# Patient Record
Sex: Female | Born: 1970 | Race: White | Hispanic: No | Marital: Married
Health system: Midwestern US, Community
[De-identification: ages and names within clinical notes are randomized; demographics above are authoritative.]

## PROBLEM LIST (undated history)

## (undated) DIAGNOSIS — R002 Palpitations: Secondary | ICD-10-CM

## (undated) DIAGNOSIS — J45909 Unspecified asthma, uncomplicated: Secondary | ICD-10-CM

## (undated) DIAGNOSIS — E079 Disorder of thyroid, unspecified: Secondary | ICD-10-CM

## (undated) DIAGNOSIS — U071 COVID-19: Secondary | ICD-10-CM

## (undated) DIAGNOSIS — K5900 Constipation, unspecified: Secondary | ICD-10-CM

## (undated) DIAGNOSIS — T7840XA Allergy, unspecified, initial encounter: Secondary | ICD-10-CM

## (undated) DIAGNOSIS — F419 Anxiety disorder, unspecified: Secondary | ICD-10-CM

## (undated) DIAGNOSIS — K219 Gastro-esophageal reflux disease without esophagitis: Secondary | ICD-10-CM

## (undated) DIAGNOSIS — I82409 Acute embolism and thrombosis of unspecified deep veins of unspecified lower extremity: Secondary | ICD-10-CM

## (undated) DIAGNOSIS — D649 Anemia, unspecified: Secondary | ICD-10-CM

## (undated) DIAGNOSIS — G709 Myoneural disorder, unspecified: Secondary | ICD-10-CM

## (undated) HISTORY — DX: Anemia, unspecified: D64.9

## (undated) HISTORY — DX: Myoneural disorder, unspecified: G70.9

## (undated) HISTORY — DX: Unspecified asthma, uncomplicated: J45.909

## (undated) HISTORY — PX: TONSILLECTOMY: SUR1361

## (undated) HISTORY — DX: Palpitations: R00.2

## (undated) HISTORY — PX: APPENDECTOMY: SHX54

## (undated) HISTORY — DX: Gastro-esophageal reflux disease without esophagitis: K21.9

## (undated) HISTORY — DX: Allergy, unspecified, initial encounter: T78.40XA

## (undated) HISTORY — DX: Constipation, unspecified: K59.00

## (undated) HISTORY — PX: CARPAL TUNNEL RELEASE: SHX101

## (undated) HISTORY — PX: COLONOSCOPY: SHX174

## (undated) HISTORY — PX: SPINAL FUSION: SHX223

## (undated) HISTORY — DX: Acute embolism and thrombosis of unspecified deep veins of unspecified lower extremity: I82.409

## (undated) HISTORY — DX: Disorder of thyroid, unspecified: E07.9

## (undated) HISTORY — DX: Anxiety disorder, unspecified: F41.9

## (undated) HISTORY — DX: COVID-19: U07.1

## (undated) HISTORY — PX: ROTATOR CUFF REPAIR: SHX139

---

## 1998-05-04 NOTE — ED Provider Notes (Signed)
[facility]                      EMERGENCY DEPARTMENT TREATMENT REPORT   NAME:  Vickie, Vickie [name]   MR#:  [id number]     BILLING #: [id number]         DOS: 05/04/1998  TIME:11:32                                                                    P   cc:   RICHARD L. Jama Flavors, M.D.   Primary Physician:   CHIEF COMPLAINT:   Intermittent shortness of breath and dizziness, syncope.   HISTORY OF PRESENT ILLNESS:   The patient states that since yesterday she   has had some intermittent episodes of shortness of breath, but no coughing   or chest pain.  She also denies having any numbness or tingling in her face   or extremities.  She has had three episodes today of some very short-lived   syncopal episodes, one witnessed by her husband in the Emergency Department   while awaiting evaluation.  She has had no upper respiratory infection   symptoms, nor has she had any fever, chills, coughing, or chest pain.  She   denies any urinary tract infection symptoms or vaginal discharge and has   been feeling normal fetal heart movement. She had called Dr. Jari Favre,   her OB/GYN doctor this morning and was told to come to the Emergency   Department. He called and asked that we evaluate the patient.   REVIEW OF SYSTEMS:   CONSTITUTIONAL:  No fever, chills, weight loss.   EYES:  She has some floaters associated with dizziness and syncope in the   past 48 hours.   ENT: No sore throat, runny nose or other URI symptoms.   ENDOCRINE:  No diabetic symptoms.   RESPIRATORY: Intermittent shortness of breath over the past two days, but   no cough or wheezing.   CARDIOVASCULAR:  No chest pain, chest pressure, or palpitations.   GASTROINTESTINAL:  No vomiting, diarrhea, or abdominal pain.   GENITOURINARY:  No dysuria, frequency, or urgency.   MUSCULOSKELETAL:  No joint pain or swelling.   NEUROLOGICAL:   The dizziness with syncope over the past two days.    PAST MEDICAL HISTORY:   No chronic illnesses, but did have an episode of   toxemia late in her first pregnancy and currently is [redacted] weeks pregnant.   G2-P1-AB0.   ALLERGIES:  Erythromycin.   MEDICATIONS:  Prenatal vitamins.   PHYSICAL EXAMINATION:   GENERAL:   28 -year-old white female who is alert and oriented   times three.   VITAL SIGNS:  Blood pressure 136/72, temperature 97.8, pulse 80,   respirations 20, and O2 sat 99% on room air.   HEENT:  Eyes PERL, EOMs within normal limits. Sinuses nontender.   NECK:  Nontender without masses.   LUNGS:  Clear to auscultation bilaterally.   HEART:  Regular rate and rhythm.  No murmurs, gallops or rubs.   ABDOMEN:  Gravid, BS positive, soft, and nontender.   NEUROLOGIC:  Cranial nerves, deep tendon reflexes, strength, and light   touch sensation are unremarkable - done with the patient supine in  bed.   CONTINUATION BY DR. MANOLIO:   IMPRESSION/MANAGEMENT PLAN:  This is a new problem for this patient.  As an   acute illness posing a potential threat to life or bodily function, this is   a high risk presentation necessitating an immediate diagnostic evaluation.   Old records were reviewed.  No additional relevant information was   obtained.  The patient's history was discussed  with family members.  No   additional relevant information was obtained.  Nursing notes were reviewed.   DIAGNOSTIC TESTING:  The EKG showed normal sinus rhythm and no ischemic   changes.  Urinalysis is negative, the i-STAT is normal with a BUN of 7 and   H&amp;H of 12 and 34.  Blood gases on room air showed a pH of 7.43, pC02 31.9,   p02 95.8, 02 saturation 98%.   COURSE IN THE EMERGENCY DEPARTMENT:  Fetal heart tones were done were 160   and regular.  The patient was ordered t have orthostatic vital signs, but   she passed out on standing so these were canceled.  She received two liters   of Lactated Ringer's and was attempted to ambulate at 1615; and she became    very dizzy and near syncopal again.  Orthostatic vital signs were done at   that time which were essentially normal.   I contacted Dr. Marden Noble, who asked me to call Dr. Wallace Cullens, who is in house.  Dr.   Wallace Cullens request that I contact Cardiology and Dr. Cleon Gustin is here seeing the   patient.   FINAL DIAGNOSIS:   1.  Recurrent syncope/near syncope.   2.  Six months' pregnancy.   DISPOSITION:  Per Dr. Cleon Gustin.   _________________________________   [name]. Jama Flavors, M.D.   zga/cd  D:  05/04/1998 T:  05/04/1998 11:41 A   [id number]   Claris Pong, PA

## 1998-05-04 NOTE — Procedures (Signed)
[facility]                              CARDIOLOGY DEPARTMENT                              ECHOCARDIOGRAM REPORT   Name: [name], Vickie                 Location: OP       Age: 27   Date: 05/05/1998   Ref Phys: C. Ashby                     MR#: [id number]      Tape/Index:   [id number]   Reason For Study: Syncope   M-Mode Measurements (Parasternal):              Measured:               Nor   mal:   RV Internal Diastolic Diameter                   20 mm      7-23 mm   Ventricular Septal Thickness                     10 mm      7-11 mm   LV Internal Diastolic Diameter                   39 mm      37-54 mm   LV Posterior Wall Thickness                       9 mm      7-11 mm   LV Internal Systolic Diameter                    26 mm   Aortic Root Diameter                             27 mm      20-37 mm   Aortic Valve Cusp Separation                     18 mm      16-26 mm   Left Atrial Diameter                             30 mm      19-40 mm   2-Dimensional and Doppler Findings:   INDICATION:  Syncope.   FINDINGS:   1.   There  is  overall  normal  left ventricular  systolic  function  with       estimated left ventricular ejection  fraction  is  60%  .  There are no       regional wall motion abnormalities detected.   2.  Normal right heart size and function.   3.  Intrinsic, but normal aortic, mitral, and tricuspid valve.   4.  No abnormal color flow velocity patterns.   5.  No pericardial effusion.   PROCEDURES PERFORMED:  2-dimensional, color flow and Doppler analysis.   __________________________________________________   WAYNE OLD, M.D.   bes  D: 05/05/1998  T: 05/06/1998  1:15   P    [id number]

## 1998-05-04 NOTE — H&P (Signed)
[facility]                              HISTORY AND PHYSICAL   NAME:    Vickie [name], Vickie [name]   MR#:     [id number]                           ADM DATE:         05/04/1998   SS#      [id number]                        PT. LOCATION      [facility]   Harland German., M.D.   cc:   Harland German., M.D.         Franki Cabot Hubert Azure, M.D.         Glenna Durand, M.D.-[facility]   CHIEF COMPLAINT:  "I am passing out."    Manha Amato is a 28 year old Caucasian woman (DOB Jan 08, 2071) with the   following medical problems:   1.  Dizziness and syncope.         A)  Orthostatic increase in heart rate with no drop in blood pressure               following 2 liter Lactated Ringer's infusion.         B)  No anemia or history to suggest dehydration.         C)  Rule out cardiomyopathy or paracardial effusion.   2.  Gravida II, Para I--25 week uncomplicated intrauterine pregnancy.   3.  History of fibromyalgia.   4.  Surgical history-status post C-section and remote appendectomy.   ALLERGIES:  Erythromycin.   HISTORY OF PRESENT ILLNESS:  Ms. Ruegg is followed by Dr. Edwyna Shell for   general medical care with no history of any known general medical problems   other than her fibromyalgia.  This has been under control and does not   require specific therapy.  The patient reports she has being well with her   second pregnancy.   The patient presents with a 2-day history of dizziness.  She reports   feeling "light-headed" from time to time and seeing "spots in from of my   eyes" yesterday.  Today, she has had several episodes of being very dizzy   and on two occasionally passing out and falling to the floor after she   stands up.  She notes that her heart beats faster when she is doing any   activity and her heart beats faster when she is feeling light-headed.  She   has not head any chest pain, nausea or vomiting.  She has noted shortness    of breath yesterday and today when she would climb stairs-no episodes of   any PND or orthopnea.  She has had mild pedal edema-no worse than with her   previous pregnancy.   The patient came to the emergency department for evaluation.  When   orthostatic blood pressures were initially attempted, she became nearly   syncopal and had to be helped back to the stretcher-the seated and standing   blood pressures were not obtained initially. The patient was given 2 liters   of lactated Ringer's and felt somewhat better.  Dr. Wallace Cullens was contacted and   asked that we see Ms. Checketts for assistance with  her evaluation.  At the   time of my questioning, the patient does say that she is feeling better.   She denies any urinary frequency or dysuria and has not had any fever. She   does feel "a little chill" when she is dizzy.   Coronary risk factor profile is remarkable only for a family history of   coronary disease-paternal grandfather and uncle both had coronary bypass.   The patient denies hypertension or diabetes and has never smoked   cigarettes.  Cholesterol status is unknown.   MEDICATIONS ON ADMISSION :  Prenatal vitamins only.   SOCIAL HISTORY:  The patient is married and has one healthy child.  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   REVIEW OF SYSTEMS:  Unremarkable except as noted, with no GU or previous   neurologic complaints.  She has not had any headache or GI bleeding.   PHYSICAL EXAMINATION:   GENERAL:  Shows a well-developed, well-nourished woman who is not in any   distress at the time of examination.   VITAL SIGNS:  Pulse is 85 and regular supine, seated and standing blood   pressures are 130/50 with heart rate increasing from 85 supine to 95 seated   and than 120-122 standing.   NECK:  Not distended.   LUNGS:  Clear.   CARDIAC:  Shows regular rate and rhythm.  There is no abdominal tenderness   and no significant pedal edema.    OBJECTIVE DATA:  The 12-lead ECG shows sinus rhythm, within normal limits.   Arterial blood gasses on room air showed pO2 96, pCO2 32, pH 7.43. Sodium   is 141, potassium 3.8, BUN 7, on I-STAT testing.  Hematocrit was 34%.   IMPRESSION:  The patient has symptomatic orthostasis that has been   partially corrected with IV fluids.  Her blood pressure no longer drops,   but she does feel dizzy and has orthostatic increase in heart rate.  She   has no symptoms to suggest blood loss or urinary tract infection.   Echocardiography will be needed to rule out pericardial effusion or   cardiomyopathy.   PLAN:  The patient will be monitored overnight in the [facility] with plans to   recheck her orthostatic blood pressures in the morning.  Echocardiography   will be obtained to rule out pregnancy-related cardiomyopathy or   pericardial effusion as possible causes for her otherwise unexplained   orthostasis.   I am grateful to Dr. Wallace Cullens for the opportunity of seeing Ms. Cathlean Marseilles   ______________________________   Harland German., M.D.   Jonte.Jarred  D:  05/04/1998  T:  05/04/1998  5:55 P   [id number]

## 1998-05-06 NOTE — Procedures (Signed)
[facility]                              CARDIOLOGY DEPARTMENT                              ECHOCARDIOGRAM REPORT   Name: SUZZANNE, BRUNKHORST                 Location: OP       Age: 28   Date: 05/05/1998   Ref Phys: C. Cleon Gustin                     MR#: [id number]      Tape/Index:   [id number]   Reason For Study: Syncope   M-Mode Measurements (Parasternal):              Measured:               Nor   mal:   RV Internal Diastolic Diameter                   20 mm      7-23 mm   Ventricular Septal Thickness                     10 mm      7-11 mm   LV Internal Diastolic Diameter                   39 mm      37-54 mm   LV Posterior Wall Thickness                       9 mm      7-11 mm   LV Internal Systolic Diameter                    26 mm   Aortic Root Diameter                             27 mm      20-37 mm   Aortic Valve Cusp Separation                     18 mm      16-26 mm   Left Atrial Diameter                             30 mm      19-40 mm   2-Dimensional and Doppler Findings:   INDICATION:  Syncope.   FINDINGS:   1.   There  is  overall  normal  left ventricular  systolic  function  with       estimated left ventricular ejection  fraction  is  60%  .  There are no       regional wall motion abnormalities detected.   2.  Normal right heart size and function.   3.  Intrinsic, but normal aortic, mitral, and tricuspid valve.   4.  No abnormal color flow velocity patterns.   5.  No pericardial effusion.   PROCEDURES PERFORMED:  2-dimensional, color flow and Doppler analysis.   __________________________________________________   Sandre Kitty, M.D.   bes  D: 05/05/1998  T: 05/06/1998  1:15  P    [id number]

## 2019-04-08 ENCOUNTER — Other Ambulatory Visit: Payer: Self-pay

## 2019-04-08 ENCOUNTER — Emergency Department (HOSPITAL_COMMUNITY)
Admission: EM | Admit: 2019-04-08 | Discharge: 2019-04-09 | Discharge status: Against Medical Advice | Payer: Commercial Managed Care - PPO | Attending: Emergency Medicine | Admitting: Emergency Medicine

## 2019-04-08 DIAGNOSIS — Z5321 Procedure and treatment not carried out due to patient leaving prior to being seen by health care provider: Secondary | ICD-10-CM | POA: Diagnosis not present

## 2019-04-08 DIAGNOSIS — R519 Headache, unspecified: Secondary | ICD-10-CM | POA: Diagnosis present

## 2019-04-08 LAB — COMPREHENSIVE METABOLIC PANEL
ALT: 30 U/L (ref 0–44)
AST: 35 U/L (ref 15–41)
Albumin: 3.6 g/dL (ref 3.5–5.0)
Alkaline Phosphatase: 86 U/L (ref 38–126)
Anion gap: 10 (ref 5–15)
BUN: 9 mg/dL (ref 6–20)
CO2: 23 mmol/L (ref 22–32)
Calcium: 9.2 mg/dL (ref 8.9–10.3)
Chloride: 106 mmol/L (ref 98–111)
Creatinine, Ser: 0.62 mg/dL (ref 0.44–1.00)
GFR calc Af Amer: >60 mL/min (ref >60)
GFR calc non Af Amer: >60 mL/min (ref >60)
Glucose, Bld: 104 mg/dL — ABNORMAL HIGH (ref 70–99)
Potassium: 3.8 mmol/L (ref 3.5–5.1)
Sodium: 139 mmol/L (ref 135–145)
Total Bilirubin: 0.2 mg/dL — ABNORMAL LOW (ref 0.3–1.2)
Total Protein: 6.8 g/dL (ref 6.5–8.1)

## 2019-04-08 LAB — CBC WITH DIFFERENTIAL/PLATELET
Abs Immature Granulocytes: 0.03 10*3/uL (ref 0.00–0.07)
Basophils Absolute: 0.1 10*3/uL (ref 0.0–0.1)
Basophils Relative: 1 %
Eosinophils Absolute: 0.3 10*3/uL (ref 0.0–0.5)
Eosinophils Relative: 3 %
HCT: 41.6 % (ref 36.0–46.0)
Hemoglobin: 13.7 g/dL (ref 12.0–15.0)
Immature Granulocytes: 0 %
Lymphocytes Relative: 44 %
Lymphs Abs: 3.7 10*3/uL (ref 0.7–4.0)
MCH: 27.8 pg (ref 26.0–34.0)
MCHC: 32.9 g/dL (ref 30.0–36.0)
MCV: 84.4 fL (ref 80.0–100.0)
Monocytes Absolute: 0.7 10*3/uL (ref 0.1–1.0)
Monocytes Relative: 8 %
Neutro Abs: 3.7 10*3/uL (ref 1.7–7.7)
Neutrophils Relative %: 44 %
Platelets: 394 10*3/uL (ref 150–400)
RBC: 4.93 MIL/uL (ref 3.87–5.11)
RDW: 15 % (ref 11.5–15.5)
WBC: 8.4 10*3/uL (ref 4.0–10.5)
nRBC: 0 % (ref 0.0–0.2)

## 2019-04-08 NOTE — ED Notes (Signed)
Pt states she is going to leave.

## 2019-04-08 NOTE — ED Triage Notes (Signed)
Pt states she had a epidural injection into her back on new years eve due to chronic back pt. Pt states the following day she had a migraine that has been constant and today she also noticed some numbness on the left side of her face. Pt has no weakness, no loss of bowel or bladder.

## 2019-05-21 ENCOUNTER — Ambulatory Visit (HOSPITAL_COMMUNITY)
Admission: RE | Admit: 2019-05-21 | Discharge: 2019-05-21 | Disposition: A | Discharge status: Home / Self Care | Payer: HRSA Program | Source: Ambulatory Visit | Attending: Pulmonary Disease | Admitting: Pulmonary Disease

## 2019-05-21 ENCOUNTER — Other Ambulatory Visit: Payer: Self-pay | Admitting: Nurse Practitioner

## 2019-05-21 ENCOUNTER — Encounter (HOSPITAL_COMMUNITY): Payer: Self-pay

## 2019-05-21 DIAGNOSIS — U071 COVID-19: Secondary | ICD-10-CM

## 2019-05-21 DIAGNOSIS — Z6835 Body mass index (BMI) 35.0-35.9, adult: Secondary | ICD-10-CM | POA: Insufficient documentation

## 2019-05-21 DIAGNOSIS — E6609 Other obesity due to excess calories: Secondary | ICD-10-CM

## 2019-05-21 MED ORDER — SODIUM CHLORIDE 0.9 % IV SOLN
INTRAVENOUS | Status: DC | PRN
  Administered 2019-05-21: 17:00:00 250 mL via INTRAVENOUS

## 2019-05-21 MED ORDER — SODIUM CHLORIDE 0.9 % IV SOLN
700.0000 mg | Freq: Once | INTRAVENOUS | Status: AC
  Administered 2019-05-21: 700 mg via INTRAVENOUS
  Filled 2019-05-21: qty 20

## 2019-05-21 MED ORDER — FAMOTIDINE IN NACL 20-0.9 MG/50ML-% IV SOLN: 20.0000 mg | Freq: Once | INTRAVENOUS | Status: DC | PRN

## 2019-05-21 MED ORDER — ALBUTEROL SULFATE HFA 108 (90 BASE) MCG/ACT IN AERS: 2.0000 | INHALATION_SPRAY | Freq: Once | RESPIRATORY_TRACT | Status: DC | PRN

## 2019-05-21 MED ORDER — EPINEPHRINE 0.3 MG/0.3ML IJ SOAJ: 0.3000 mg | Freq: Once | INTRAMUSCULAR | Status: DC | PRN

## 2019-05-21 MED ORDER — METHYLPREDNISOLONE SODIUM SUCC 125 MG IJ SOLR: 125.0000 mg | Freq: Once | INTRAMUSCULAR | Status: DC | PRN

## 2019-05-21 MED ORDER — DIPHENHYDRAMINE HCL 50 MG/ML IJ SOLN: 50.0000 mg | Freq: Once | INTRAMUSCULAR | Status: DC | PRN

## 2019-05-21 NOTE — Progress Notes (Signed)
  I connected by phone with Jean Huber on 05/21/2019 at 3:09 PM to discuss the potential use of an new treatment for mild to moderate COVID-19 viral infection in non-hospitalized patients.  This patient is a 49 y.o. female that meets the FDA criteria for Emergency Use Authorization of bamlanivimab or casirivimab\imdevimab.  Has a (+) direct SARS-CoV-2 viral test result  Has mild or moderate COVID-19   Is ? 51 years of age and weighs ? 40 kg  Is NOT hospitalized due to COVID-19  Is NOT requiring oxygen therapy or requiring an increase in baseline oxygen flow rate due to COVID-19  Is within 10 days of symptom onset  Has at least one of the high risk factor(s) for progression to severe COVID-19 and/or hospitalization as defined in EUA.  Specific high risk criteria : BMI >/= 35   I have spoken and communicated the following to the patient or parent/caregiver:  1. FDA has authorized the emergency use of bamlanivimab and casirivimab\imdevimab for the treatment of mild to moderate COVID-19 in adults and pediatric patients with positive results of direct SARS-CoV-2 viral testing who are 50 years of age and older weighing at least 40 kg, and who are at high risk for progressing to severe COVID-19 and/or hospitalization.  2. The significant known and potential risks and benefits of bamlanivimab and casirivimab\imdevimab, and the extent to which such potential risks and benefits are unknown.  3. Information on available alternative treatments and the risks and benefits of those alternatives, including clinical trials.  4. Patients treated with bamlanivimab and casirivimab\imdevimab should continue to self-isolate and use infection control measures (e.g., wear mask, isolate, social distance, avoid sharing personal items, clean and disinfect "high touch" surfaces, and frequent handwashing) according to CDC guidelines.   5. The patient or parent/caregiver has the option to accept or refuse  bamlanivimab or casirivimab\imdevimab .  After reviewing this information with the patient, The patient agreed to proceed with receiving the bamlanimivab infusion and will be provided a copy of the Fact sheet prior to receiving the infusion.Fenton Foy 05/21/2019 3:09 PM

## 2019-05-21 NOTE — Progress Notes (Signed)
[name], female   DOB: 10/20/1970, 49 y.o.   MRN: [id number]  Diagnosis: [id number]  Procedure: [facility] Med: bamlanivimab infusion - Provided patient with bamlanimivab fact sheet for patients, parents and caregivers prior to infusion.  Complications: No immediate complications noted.  Discharge: Discharged home   Heide Scales 05/21/2019

## 2019-05-21 NOTE — Discharge Instructions (Signed)

## 2019-05-25 ENCOUNTER — Other Ambulatory Visit (HOSPITAL_COMMUNITY): Payer: Self-pay

## 2019-07-28 ENCOUNTER — Encounter: Payer: Self-pay | Admitting: Gastroenterology

## 2019-09-09 ENCOUNTER — Other Ambulatory Visit: Payer: Self-pay

## 2019-09-09 ENCOUNTER — Ambulatory Visit (AMBULATORY_SURGERY_CENTER): Payer: Self-pay | Admitting: *Deleted

## 2019-09-09 VITALS — Ht 65.0 in | Wt 211.0 lb

## 2019-09-09 DIAGNOSIS — Z01818 Encounter for other preprocedural examination: Secondary | ICD-10-CM

## 2019-09-09 DIAGNOSIS — Z1211 Encounter for screening for malignant neoplasm of colon: Secondary | ICD-10-CM

## 2019-09-09 MED ORDER — SUTAB 1479-225-188 MG PO TABS: 24.0000 | ORAL_TABLET | ORAL | 0 refills | Status: DC

## 2019-09-09 NOTE — Progress Notes (Signed)
No egg or soy allergy known to patient  No issues with past sedation with any surgeries  or procedures, no intubation problems  No diet pills per patient No home 02 use per patient  No blood thinners per patient  Pt denies issues with constipation  currently -- occ constipation now- mostly soft regular Bm's  No A fib or A flutter  EMMI video sent to pt's e mail   Due to the COVID-19 pandemic we are asking patients to follow these guidelines. Please only bring one care partner. Please be aware that your care partner may wait in the car in the parking lot or if they feel like they will be too hot to wait in the car, they may wait in the lobby on the 4th floor. All care partners are required to wear a mask the entire time (we do not have any that we can provide them), they need to practice social distancing, and we will do a Covid check for all patient's and care partners when you arrive. Also we will check their temperature and your temperature. If the care partner waits in their car they need to stay in the parking lot the entire time and we will call them on their cell phone when the patient is ready for discharge so they can bring the car to the front of the building. Also all patient's will need to wear a mask into building.

## 2019-09-16 ENCOUNTER — Encounter: Payer: Self-pay | Admitting: Gastroenterology

## 2019-09-21 ENCOUNTER — Other Ambulatory Visit: Payer: Self-pay | Admitting: Gastroenterology

## 2019-09-21 ENCOUNTER — Ambulatory Visit (INDEPENDENT_AMBULATORY_CARE_PROVIDER_SITE_OTHER): Payer: Commercial Managed Care - PPO

## 2019-09-21 DIAGNOSIS — Z1159 Encounter for screening for other viral diseases: Secondary | ICD-10-CM

## 2019-09-21 LAB — SARS CORONAVIRUS 2 (TAT 6-24 HRS): SARS Coronavirus 2: NEGATIVE

## 2019-09-23 ENCOUNTER — Other Ambulatory Visit: Payer: Self-pay

## 2019-09-23 ENCOUNTER — Ambulatory Visit (AMBULATORY_SURGERY_CENTER): Payer: Commercial Managed Care - PPO | Admitting: Gastroenterology

## 2019-09-23 ENCOUNTER — Encounter: Payer: Self-pay | Admitting: Gastroenterology

## 2019-09-23 VITALS — BP 133/79 | HR 74 | Temp 97.3°F | Resp 18 | Ht 65.0 in | Wt 211.0 lb

## 2019-09-23 DIAGNOSIS — Z1211 Encounter for screening for malignant neoplasm of colon: Secondary | ICD-10-CM | POA: Diagnosis not present

## 2019-09-23 DIAGNOSIS — D122 Benign neoplasm of ascending colon: Secondary | ICD-10-CM

## 2019-09-23 DIAGNOSIS — K635 Polyp of colon: Secondary | ICD-10-CM

## 2019-09-23 DIAGNOSIS — Z800 Family history of malignant neoplasm of digestive organs: Secondary | ICD-10-CM | POA: Diagnosis not present

## 2019-09-23 DIAGNOSIS — D125 Benign neoplasm of sigmoid colon: Secondary | ICD-10-CM

## 2019-09-23 MED ORDER — SODIUM CHLORIDE 0.9 % IV SOLN: 500.0000 mL | INTRAVENOUS | Status: DC

## 2019-09-23 NOTE — Patient Instructions (Signed)
Read all of the handouts given to you by your recovery room nurse.  Thank-you for choosing us for your healthcare needs today.  YOU HAD AN ENDOSCOPIC PROCEDURE TODAY AT THE [facility]:   Refer to the procedure report that was given to you for any specific questions about what was found during the examination.  If the procedure report does not answer your questions, please call your gastroenterologist to clarify.  If you requested that your care partner not be given the details of your procedure findings, then the procedure report has been included in a sealed envelope for you to review at your convenience later.  YOU SHOULD EXPECT: Some feelings of bloating in the abdomen. Passage of more gas than usual.  Walking can help get rid of the air that was put into your GI tract during the procedure and reduce the bloating. If you had a lower endoscopy (such as a colonoscopy or flexible sigmoidoscopy) you may notice spotting of blood in your stool or on the toilet paper. If you underwent a bowel prep for your procedure, you may not have a normal bowel movement for a few days.  Please Note:  You might notice some irritation and congestion in your nose or some drainage.  This is from the oxygen used during your procedure.  There is no need for concern and it should clear up in a day or so.  SYMPTOMS TO REPORT IMMEDIATELY:   Following lower endoscopy (colonoscopy or flexible sigmoidoscopy):  Excessive amounts of blood in the stool  Significant tenderness or worsening of abdominal pains  Swelling of the abdomen that is new, acute  Fever of 100F or higher   For urgent or emergent issues, a gastroenterologist can be reached at any hour by calling [phone]. Do not use MyChart messaging for urgent concerns.    DIET:  We do recommend a small meal at first, but then you may proceed to your regular diet.  Drink plenty of fluids but you should avoid alcoholic beverages for 24 hours. Try to  increase the fiber in your diet, and drink plenty of water.  ACTIVITY:  You should plan to take it easy for the rest of today and you should NOT DRIVE or use heavy machinery until tomorrow (because of the sedation medicines used during the test).    FOLLOW UP: Our staff will call the number listed on your records 48-72 hours following your procedure to check on you and address any questions or concerns that you may have regarding the information given to you following your procedure. If we do not reach you, we will leave a message.  We will attempt to reach you two times.  During this call, we will ask if you have developed any symptoms of COVID 19. If you develop any symptoms (ie: fever, flu-like symptoms, shortness of breath, cough etc.) before then, please call [phone].  If you test positive for Covid 19 in the 2 weeks post procedure, please call and report this information to us.    If any biopsies were taken you will be contacted by phone or by letter within the next 1-3 weeks.  Please call us at [phone] if you have not heard about the biopsies in 3 weeks.    SIGNATURES/CONFIDENTIALITY: You and/or your care partner have signed paperwork which will be entered into your electronic medical record.  These signatures attest to the fact that that the information above on your After Visit Summary has been reviewed   and is understood.  Full responsibility of the confidentiality of this discharge information lies with you and/or your care-partner. 

## 2019-09-23 NOTE — Op Note (Addendum)
[facility] Patient Name: Jean Huber Procedure Date: 09/23/2019 9:45 AM MRN: [id number] Endoscopist: Napoleon Form , MD Age: 49 Referring MD:  Date of Birth: [birth year] Gender: Female Account #: [id number] Procedure:                Colonoscopy Indications:              Screening for colorectal malignant neoplasm, Colon                            cancer screening in patient at increased risk:                            Family history of colorectal cancer in multiple 2nd                            degree relatives Medicines:                Monitored Anesthesia Care Procedure:                Pre-Anesthesia Assessment:                           - Prior to the procedure, a History and Physical                            was performed, and patient medications and                            allergies were reviewed. The patient's tolerance of                            previous anesthesia was also reviewed. The risks                            and benefits of the procedure and the sedation                            options and risks were discussed with the patient.                            All questions were answered, and informed consent                            was obtained. Prior Anticoagulants: The patient has                            taken no previous anticoagulant or antiplatelet                            agents. ASA Grade Assessment: II - A patient with                            mild systemic disease. After reviewing the risks  and benefits, the patient was deemed in                            satisfactory condition to undergo the procedure.                           After obtaining informed consent, the colonoscope                            was passed under direct vision. Throughout the                            procedure, the patient's blood pressure, pulse, and                            oxygen saturations were monitored  continuously. The                            Colonoscope was introduced through the anus and                            advanced to the the cecum, identified by                            appendiceal orifice and ileocecal valve. The                            colonoscopy was performed without difficulty. The                            patient tolerated the procedure well. The quality                            of the bowel preparation was good. The ileocecal                            valve, appendiceal orifice, and rectum were                            photographed. Scope In: 9:56:27 AM Scope Out: 10:11:27 AM Scope Withdrawal Time: 0 hours 10 minutes 43 seconds  Total Procedure Duration: 0 hours 15 minutes 0 seconds  Findings:                 The perianal and digital rectal examinations were                            normal.                           Two sessile polyps were found in the sigmoid colon                            and ascending colon. The polyps were 1 to 2 mm in  size. These polyps were removed with a cold biopsy                            forceps. Resection and retrieval were complete.                           Scattered small-mouthed diverticula were found in                            the sigmoid colon.                           Non-bleeding internal hemorrhoids were found during                            retroflexion. The hemorrhoids were small.                           The exam was otherwise without abnormality. Complications:            No immediate complications. Estimated Blood Loss:     Estimated blood loss was minimal. Impression:               - Two 1 to 2 mm polyps in the sigmoid colon and in                            the ascending colon, removed with a cold biopsy                            forceps. Resected and retrieved.                           - Diverticulosis in the sigmoid colon.                           - Non-bleeding  internal hemorrhoids.                           - The examination was otherwise normal. Recommendation:           - Patient has a contact number available for                            emergencies. The signs and symptoms of potential                            delayed complications were discussed with the                            patient. Return to normal activities tomorrow.                            Written discharge instructions were provided to the                            patient.                           -  Resume previous diet.                           - Continue present medications.                           - Await pathology results.                           - Repeat colonoscopy in 5 years for surveillance                            based on pathology results. Napoleon Form, MD 09/23/2019 10:21:09 AM This report has been signed electronically.

## 2019-09-23 NOTE — Progress Notes (Signed)
Vs VV  I have reviewed the patient's medical history in detail and updated the computerized patient record.   

## 2019-09-23 NOTE — Progress Notes (Signed)
Called to room to assist during endoscopic procedure.  Patient ID and intended procedure confirmed with present staff. Received instructions for my participation in the procedure from the performing physician.  

## 2019-09-23 NOTE — Progress Notes (Signed)
A/ox3, pleased with MAC, report to RN 

## 2019-09-27 ENCOUNTER — Telehealth: Payer: Self-pay | Admitting: *Deleted

## 2019-09-27 ENCOUNTER — Telehealth: Payer: Self-pay

## 2019-09-27 NOTE — Telephone Encounter (Signed)
Attempted to reach pt. With follow-up call following endoscopic procedure 09/23/2019.  LM on pt. Voice mail.  Will try to reach pt. Again later today.

## 2019-09-27 NOTE — Telephone Encounter (Signed)
Second follow up call made. 

## 2019-09-30 ENCOUNTER — Encounter: Payer: Self-pay | Admitting: Gastroenterology

## 2019-11-29 ENCOUNTER — Other Ambulatory Visit: Payer: Self-pay

## 2019-11-29 ENCOUNTER — Encounter (HOSPITAL_COMMUNITY): Payer: Self-pay | Admitting: Emergency Medicine

## 2019-11-29 ENCOUNTER — Emergency Department (HOSPITAL_COMMUNITY)
Admission: EM | Admit: 2019-11-29 | Discharge: 2019-11-29 | Disposition: A | Discharge status: Home / Self Care | Payer: Commercial Managed Care - PPO | Attending: Emergency Medicine | Admitting: Emergency Medicine

## 2019-11-29 DIAGNOSIS — Y939 Activity, unspecified: Secondary | ICD-10-CM | POA: Diagnosis not present

## 2019-11-29 DIAGNOSIS — Z79899 Other long term (current) drug therapy: Secondary | ICD-10-CM | POA: Diagnosis not present

## 2019-11-29 DIAGNOSIS — Z7952 Long term (current) use of systemic steroids: Secondary | ICD-10-CM | POA: Diagnosis not present

## 2019-11-29 DIAGNOSIS — X58XXXA Exposure to other specified factors, initial encounter: Secondary | ICD-10-CM | POA: Diagnosis not present

## 2019-11-29 DIAGNOSIS — Y929 Unspecified place or not applicable: Secondary | ICD-10-CM | POA: Diagnosis not present

## 2019-11-29 DIAGNOSIS — J45909 Unspecified asthma, uncomplicated: Secondary | ICD-10-CM | POA: Insufficient documentation

## 2019-11-29 DIAGNOSIS — Z7951 Long term (current) use of inhaled steroids: Secondary | ICD-10-CM | POA: Diagnosis not present

## 2019-11-29 DIAGNOSIS — T7840XA Allergy, unspecified, initial encounter: Secondary | ICD-10-CM | POA: Diagnosis not present

## 2019-11-29 DIAGNOSIS — Y999 Unspecified external cause status: Secondary | ICD-10-CM | POA: Insufficient documentation

## 2019-11-29 MED ORDER — PREDNISONE 10 MG (21) PO TBPK: ORAL_TABLET | ORAL | 0 refills | Status: AC

## 2019-11-29 MED ORDER — LIDOCAINE VISCOUS HCL 2 % MT SOLN
15.0000 mL | Freq: Once | OROMUCOSAL | Status: AC
  Administered 2019-11-29: 15 mL via ORAL
  Filled 2019-11-29: qty 15

## 2019-11-29 MED ORDER — FAMOTIDINE 20 MG PO TABS
20.0000 mg | ORAL_TABLET | Freq: Once | ORAL | Status: AC
  Administered 2019-11-29: 20 mg via ORAL
  Filled 2019-11-29: qty 1

## 2019-11-29 MED ORDER — LORATADINE 10 MG PO TABS: 10.0000 mg | ORAL_TABLET | Freq: Every day | ORAL | 0 refills | Status: DC

## 2019-11-29 MED ORDER — DIPHENHYDRAMINE HCL 50 MG/ML IJ SOLN
25.0000 mg | Freq: Once | INTRAMUSCULAR | Status: AC
  Administered 2019-11-29: 25 mg via INTRAVENOUS
  Filled 2019-11-29: qty 1

## 2019-11-29 MED ORDER — DEXAMETHASONE SODIUM PHOSPHATE 10 MG/ML IJ SOLN
10.0000 mg | Freq: Once | INTRAMUSCULAR | Status: AC
  Administered 2019-11-29: 10 mg via INTRAVENOUS
  Filled 2019-11-29: qty 1

## 2019-11-29 MED ORDER — ALUM & MAG HYDROXIDE-SIMETH 200-200-20 MG/5ML PO SUSP
30.0000 mL | Freq: Once | ORAL | Status: AC
  Administered 2019-11-29: 30 mL via ORAL
  Filled 2019-11-29: qty 30

## 2019-11-29 MED ORDER — PREDNISONE 10 MG (21) PO TBPK: ORAL_TABLET | ORAL | 0 refills | Status: DC

## 2019-11-29 NOTE — Discharge Instructions (Signed)
Take the medications as prescribed.  Follow-up with an allergist for further evaluation.

## 2019-11-29 NOTE — ED Triage Notes (Addendum)
Started on doxycycline Saturday after 1 dose, took benadryl, steroids, now having difficulty swallowing.  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  Had 2nd [employer] vaccine on Tuesday.  Had covid in February, still has no taste or smell.

## 2019-11-29 NOTE — ED Provider Notes (Signed)
[facility] EMERGENCY DEPARTMENT Provider Note   CSN: [id number] Arrival date & time: 11/29/19  1154     History Chief Complaint  Patient presents with  . reaction to antibx    Jean Huber is a 49 y.o. female.  HPI   Patient presented to the ED for evaluation of a possible reaction to an antibiotic.  Patient states she has been getting treatment for a skin infection.  She was started on Septra and prior to the weekend was switched to doxycycline.  Patient has been seen at [facility] surgery where she received an I&D and was started on those antibiotics.  Patient states after the doxycycline she started experiencing discomfort in her throat.  She did not continue to take it throughout the weekend but she has noted persistent rash that is itching on her torso.  She also started to feel that her lips were swelling.  She is not having any coughing.  No wheezing.  No fevers or chills.  Past Medical History:  Diagnosis Date  . Allergy   . Anemia    past hx   . Anxiety   . Asthma   . Constipation    no meds-   . COVID-19 virus infection    05-25-2019  . GERD (gastroesophageal reflux disease)    past hx - no meds now   . Neuromuscular disorder (HCC)    fibromyalgia   . Palpitations    due to Thyroid   . Thyroid disease     There are no problems to display for this patient.   Past Surgical History:  Procedure Laterality Date  . APPENDECTOMY    . CARPAL TUNNEL RELEASE Left   . CESAREAN SECTION     x 1   . COLONOSCOPY     age 1 in [place] due to diarrhea/ constipation   . ROTATOR CUFF REPAIR Right   . SPINAL FUSION     L4-L5  . TONSILLECTOMY       OB History   No obstetric history on file.     Family History  Problem Relation Age of Onset  . Breast cancer Maternal Aunt   . Colon cancer Paternal Aunt   . Kidney cancer Paternal Grandmother   . Bladder Cancer Paternal Grandmother   . Pancreatic cancer Paternal Grandfather   .  Colon cancer Cousin   . Colon polyps Neg Hx   . Esophageal cancer Neg Hx   . Rectal cancer Neg Hx   . Stomach cancer Neg Hx     Social History   Tobacco Use  . Smoking status: Never Smoker  . Smokeless tobacco: Never Used  Substance Use Topics  . Alcohol use: Yes    Comment: occ only   . Drug use: Never    Home Medications Prior to Admission medications   Medication Sig Start Date End Date Taking? Authorizing Provider  albuterol (PROVENTIL) (2.5 MG/3ML) 0.083% nebulizer solution Take 2.5 mg by nebulization every 4 (four) hours as needed for wheezing or shortness of breath.  05/13/16  Yes [provider]  albuterol (VENTOLIN HFA) 108 (90 Base) MCG/ACT inhaler Inhale 1 puff into the lungs every 6 (six) hours as needed for wheezing or shortness of breath.  03/17/17  Yes [provider]  budesonide (PULMICORT) 0.5 MG/2ML nebulizer solution Take 0.5 mg by nebulization daily.    Yes [provider]  cyclobenzaprine (FLEXERIL) 10 MG tablet Take 10 mg by mouth every 8 (eight) hours as  needed for muscle spasms.  04/09/19  Yes [provider]  EPINEPHrine 0.3 mg/0.3 mL IJ SOAJ injection Inject 0.3 mg into the muscle as needed for anaphylaxis.    Yes [provider]  fluticasone (FLONASE) 50 MCG/ACT nasal spray Place 2 sprays into both nostrils.    Yes [provider]  ibuprofen (ADVIL) 800 MG tablet Take 800 mg by mouth every 8 (eight) hours as needed for mild pain.  07/02/19  Yes [provider]  levothyroxine (SYNTHROID) 100 MCG tablet Take 100 mcg by mouth daily. 08/23/19  Yes [provider]  Multiple Vitamin (MULTIVITAMIN) capsule Take 1 capsule by mouth daily.   Yes [provider]  NUCYNTA 50 MG tablet Take 50 mg by mouth 2 (two) times daily as needed for moderate pain.  11/25/19  Yes [provider]  propranolol (INDERAL) 10 MG tablet Take 10 mg by mouth daily as needed (Chest Palpitations).    Yes  [provider]  loratadine (CLARITIN) 10 MG tablet Take 1 tablet (10 mg total) by mouth daily. 11/29/19   Dorie Rank, MD  predniSONE (STERAPRED UNI-PAK 21 TAB) 10 MG (21) TBPK tablet Take 6 tablets (60 mg total) by mouth daily for 2 days, THEN 5 tablets (50 mg total) daily for 2 days, THEN 4 tablets (40 mg total) daily for 2 days, THEN 3 tablets (30 mg total) daily for 2 days, THEN 2 tablets (20 mg total) daily for 2 days, THEN 1 tablet (10 mg total) daily for 2 days. 11/29/19 12/11/19  Dorie Rank, MD  Sodium Sulfate-Mag Sulfate-KCl (SUTAB) [phone] MG TABS Take 24 tablets by mouth as directed. Patient not taking: Reported on 11/29/2019 09/09/19   Mauri Pole, MD    Allergies    Doxycycline, Iodinated casein, Ivp dye [iodinated diagnostic agents], Rocephin [ceftriaxone], Shellfish allergy, Erythromycin, and Diclofenac  Review of Systems   Review of Systems  All other systems reviewed and are negative.   Physical Exam Updated Vital Signs BP 134/83   Pulse 76   Temp 97.6 F (36.4 C) (Oral)   Resp 18   SpO2 99%   Physical Exam Vitals and nursing note reviewed.  Constitutional:      General: She is not in acute distress.    Appearance: She is well-developed.  HENT:     Head: Normocephalic and atraumatic.     Right Ear: External ear normal.     Left Ear: External ear normal.     Mouth/Throat:     Comments: No oropharyngeal lesions, no edema or swelling Eyes:     General: No scleral icterus.       Right eye: No discharge.        Left eye: No discharge.     Conjunctiva/sclera: Conjunctivae normal.  Neck:     Trachea: No tracheal deviation.  Cardiovascular:     Rate and Rhythm: Normal rate and regular rhythm.  Pulmonary:     Effort: Pulmonary effort is normal. No respiratory distress.     Breath sounds: Normal breath sounds. No stridor. No wheezing or rales.  Abdominal:     General: Bowel sounds are normal. There is no distension.     Palpations: Abdomen is  soft.     Tenderness: There is no abdominal tenderness. There is no guarding or rebound.  Musculoskeletal:        General: No tenderness.     Cervical back: Neck supple.  Skin:    General: Skin is warm and dry.  Findings: Rash present.     Comments: Rash noted on torso and extremities, erythematous, blanches  Neurological:     Mental Status: She is alert.     Cranial Nerves: No cranial nerve deficit (no facial droop, extraocular movements intact, no slurred speech).     Sensory: No sensory deficit.     Motor: No abnormal muscle tone or seizure activity.     Coordination: Coordination normal.     ED Results / Procedures / Treatments   Labs (all labs ordered are listed, but only abnormal results are displayed) Labs Reviewed - No data to display  EKG None  Radiology No results found.  Procedures Procedures (including critical care time)  Medications Ordered in ED Medications  alum & mag hydroxide-simeth (MAALOX/MYLANTA) 200-200-20 MG/5ML suspension 30 mL (30 mLs Oral Given 11/29/19 1313)    And  lidocaine (XYLOCAINE) 2 % viscous mouth solution 15 mL (15 mLs Oral Given 11/29/19 1313)  diphenhydrAMINE (BENADRYL) injection 25 mg (25 mg Intravenous Given 11/29/19 1307)  dexamethasone (DECADRON) injection 10 mg (10 mg Intravenous Given 11/29/19 1310)  famotidine (PEPCID) tablet 20 mg (20 mg Oral Given 11/29/19 1312)    ED Course  I have reviewed the triage vital signs and the nursing notes.  Pertinent labs & imaging results that were available during my care of the patient were reviewed by me and considered in my medical decision making (see chart for details).  Clinical Course as of Nov 28 1604  Mon Nov 29, 2019  1605 Patient was given antihistamines and steroids.  Patient symptoms have improved.  Patient is ready for discharge   [JK]    Clinical Course User Index [JK] Dorie Rank, MD   MDM Rules/Calculators/A&P                          Patient presented to the ED for  evaluation of a drug reaction.  No signs of anaphylaxis on exam but patient does have systemic rash possibly urticarial nature.  Patient is no longer taking the antibiotics.  I will have her continue steroids and antihistamines.  Recommend outpatient follow-up with allergist. Final Clinical Impression(s) / ED Diagnoses Final diagnoses:  Allergic reaction, initial encounter    Rx / DC Orders ED Discharge Orders         Ordered    predniSONE (STERAPRED UNI-PAK 21 TAB) 10 MG (21) TBPK tablet        11/29/19 1605    loratadine (CLARITIN) 10 MG tablet  Daily        11/29/19 1605           Dorie Rank, MD 11/29/19 1607

## 2019-11-29 NOTE — ED Notes (Signed)
Pt reporting she's still itchy and experiencing hives at this time.

## 2020-02-08 ENCOUNTER — Other Ambulatory Visit: Payer: Self-pay | Admitting: Family Medicine

## 2020-02-09 ENCOUNTER — Other Ambulatory Visit: Payer: Self-pay | Admitting: Family Medicine

## 2020-02-16 ENCOUNTER — Other Ambulatory Visit: Payer: Self-pay | Admitting: Family Medicine

## 2020-02-16 ENCOUNTER — Other Ambulatory Visit (HOSPITAL_COMMUNITY): Payer: Self-pay | Admitting: Family Medicine

## 2020-02-16 DIAGNOSIS — R600 Localized edema: Secondary | ICD-10-CM

## 2020-02-28 ENCOUNTER — Ambulatory Visit (HOSPITAL_COMMUNITY): Payer: Commercial Managed Care - PPO

## 2020-10-24 ENCOUNTER — Encounter (HOSPITAL_BASED_OUTPATIENT_CLINIC_OR_DEPARTMENT_OTHER): Payer: Self-pay

## 2020-10-24 ENCOUNTER — Other Ambulatory Visit: Payer: Self-pay

## 2020-10-24 ENCOUNTER — Emergency Department (HOSPITAL_BASED_OUTPATIENT_CLINIC_OR_DEPARTMENT_OTHER)
Admission: EM | Admit: 2020-10-24 | Discharge: 2020-10-25 | Disposition: A | Discharge status: Home / Self Care | Payer: Commercial Managed Care - PPO | Attending: Emergency Medicine | Admitting: Emergency Medicine

## 2020-10-24 ENCOUNTER — Emergency Department (HOSPITAL_COMMUNITY)
Admission: EM | Admit: 2020-10-24 | Discharge: 2020-10-24 | Disposition: A | Discharge status: Home / Self Care | Payer: Commercial Managed Care - PPO | Attending: Emergency Medicine | Admitting: Emergency Medicine

## 2020-10-24 ENCOUNTER — Emergency Department (HOSPITAL_BASED_OUTPATIENT_CLINIC_OR_DEPARTMENT_OTHER): Payer: Commercial Managed Care - PPO

## 2020-10-24 ENCOUNTER — Ambulatory Visit: Payer: Commercial Managed Care - PPO | Admitting: Cardiovascular Disease

## 2020-10-24 DIAGNOSIS — Z79899 Other long term (current) drug therapy: Secondary | ICD-10-CM | POA: Diagnosis not present

## 2020-10-24 DIAGNOSIS — S298XXA Other specified injuries of thorax, initial encounter: Secondary | ICD-10-CM

## 2020-10-24 DIAGNOSIS — J45909 Unspecified asthma, uncomplicated: Secondary | ICD-10-CM | POA: Insufficient documentation

## 2020-10-24 DIAGNOSIS — Z7951 Long term (current) use of inhaled steroids: Secondary | ICD-10-CM | POA: Insufficient documentation

## 2020-10-24 DIAGNOSIS — S299XXA Unspecified injury of thorax, initial encounter: Secondary | ICD-10-CM | POA: Diagnosis not present

## 2020-10-24 DIAGNOSIS — Y92410 Unspecified street and highway as the place of occurrence of the external cause: Secondary | ICD-10-CM | POA: Insufficient documentation

## 2020-10-24 DIAGNOSIS — S139XXA Sprain of joints and ligaments of unspecified parts of neck, initial encounter: Secondary | ICD-10-CM

## 2020-10-24 DIAGNOSIS — S199XXA Unspecified injury of neck, initial encounter: Secondary | ICD-10-CM | POA: Diagnosis present

## 2020-10-24 DIAGNOSIS — M25511 Pain in right shoulder: Secondary | ICD-10-CM | POA: Insufficient documentation

## 2020-10-24 DIAGNOSIS — Z8616 Personal history of COVID-19: Secondary | ICD-10-CM | POA: Diagnosis not present

## 2020-10-24 DIAGNOSIS — R109 Unspecified abdominal pain: Secondary | ICD-10-CM | POA: Insufficient documentation

## 2020-10-24 DIAGNOSIS — R112 Nausea with vomiting, unspecified: Secondary | ICD-10-CM | POA: Diagnosis not present

## 2020-10-24 DIAGNOSIS — S134XXA Sprain of ligaments of cervical spine, initial encounter: Secondary | ICD-10-CM | POA: Diagnosis not present

## 2020-10-24 DIAGNOSIS — M542 Cervicalgia: Secondary | ICD-10-CM | POA: Insufficient documentation

## 2020-10-24 DIAGNOSIS — Z5321 Procedure and treatment not carried out due to patient leaving prior to being seen by health care provider: Secondary | ICD-10-CM | POA: Insufficient documentation

## 2020-10-24 DIAGNOSIS — R1030 Lower abdominal pain, unspecified: Secondary | ICD-10-CM | POA: Diagnosis not present

## 2020-10-24 DIAGNOSIS — R519 Headache, unspecified: Secondary | ICD-10-CM | POA: Insufficient documentation

## 2020-10-24 DIAGNOSIS — M545 Low back pain, unspecified: Secondary | ICD-10-CM | POA: Insufficient documentation

## 2020-10-24 LAB — CBC WITH DIFFERENTIAL/PLATELET
Abs Immature Granulocytes: 0.04 10*3/uL (ref 0.00–0.07)
Basophils Absolute: 0.1 10*3/uL (ref 0.0–0.1)
Basophils Relative: 1 %
Eosinophils Absolute: 0.1 10*3/uL (ref 0.0–0.5)
Eosinophils Relative: 0 %
HCT: 45 % (ref 36.0–46.0)
Hemoglobin: 14.9 g/dL (ref 12.0–15.0)
Immature Granulocytes: 0 %
Lymphocytes Relative: 23 %
Lymphs Abs: 3.1 10*3/uL (ref 0.7–4.0)
MCH: 28 pg (ref 26.0–34.0)
MCHC: 33.1 g/dL (ref 30.0–36.0)
MCV: 84.4 fL (ref 80.0–100.0)
Monocytes Absolute: 0.6 10*3/uL (ref 0.1–1.0)
Monocytes Relative: 5 %
Neutro Abs: 9.6 10*3/uL — ABNORMAL HIGH (ref 1.7–7.7)
Neutrophils Relative %: 71 %
Platelets: 208 10*3/uL (ref 150–400)
RBC: 5.33 MIL/uL — ABNORMAL HIGH (ref 3.87–5.11)
RDW: 14.1 % (ref 11.5–15.5)
WBC: 13.5 10*3/uL — ABNORMAL HIGH (ref 4.0–10.5)
nRBC: 0 % (ref 0.0–0.2)

## 2020-10-24 MED ORDER — MORPHINE SULFATE (PF) 4 MG/ML IV SOLN
4.0000 mg | Freq: Once | INTRAVENOUS | Status: AC
  Administered 2020-10-25: 4 mg via INTRAVENOUS
  Filled 2020-10-24: qty 1

## 2020-10-24 MED ORDER — ONDANSETRON HCL 4 MG/2ML IJ SOLN
4.0000 mg | Freq: Once | INTRAMUSCULAR | Status: AC | PRN
  Administered 2020-10-24: 4 mg via INTRAVENOUS
  Filled 2020-10-24: qty 2

## 2020-10-24 MED ORDER — PROCHLORPERAZINE EDISYLATE 10 MG/2ML IJ SOLN
10.0000 mg | Freq: Once | INTRAMUSCULAR | Status: AC
  Administered 2020-10-25: 10 mg via INTRAVENOUS
  Filled 2020-10-24: qty 2
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
## 2020-10-25 LAB — COMPREHENSIVE METABOLIC PANEL
ALT: 10 U/L (ref 0–44)
AST: 23 U/L (ref 15–41)
Albumin: 4.2 g/dL (ref 3.5–5.0)
Alkaline Phosphatase: 95 U/L (ref 38–126)
Anion gap: 13 (ref 5–15)
BUN: 15 mg/dL (ref 6–20)
CO2: 20 mmol/L — ABNORMAL LOW (ref 22–32)
Calcium: 9.7 mg/dL (ref 8.9–10.3)
Chloride: 102 mmol/L (ref 98–111)
Creatinine, Ser: 0.73 mg/dL (ref 0.44–1.00)
GFR, Estimated: >60 mL/min (ref >60)
Glucose, Bld: 100 mg/dL — ABNORMAL HIGH (ref 70–99)
Potassium: 4.4 mmol/L (ref 3.5–5.1)
Sodium: 135 mmol/L (ref 135–145)
Total Bilirubin: 0.6 mg/dL (ref 0.3–1.2)
Total Protein: 7.6 g/dL (ref 6.5–8.1)

## 2020-10-25 LAB — PREGNANCY, URINE: Preg Test, Ur: NEGATIVE

## 2020-10-25 LAB — LIPASE, BLOOD: Lipase: 35 U/L (ref 11–51)

## 2020-10-25 MED ORDER — DIPHENHYDRAMINE HCL 50 MG/ML IJ SOLN
25.0000 mg | Freq: Once | INTRAMUSCULAR | Status: AC
  Administered 2020-10-25: 25 mg via INTRAVENOUS
  Filled 2020-10-25: qty 1

## 2020-10-25 MED ORDER — PROCHLORPERAZINE MALEATE 10 MG PO TABS: 10.0000 mg | ORAL_TABLET | Freq: Four times a day (QID) | ORAL | 0 refills | Status: DC | PRN

## 2020-10-25 NOTE — ED Notes (Signed)
Pt called out stating she was having trouble breathing. Pt's SpO2 99%, HR 80. BBS clear. Educated on slow deep breathing. RN notified.

## 2020-10-25 NOTE — Discharge Instructions (Addendum)
Apply ice for 30 minutes at a time, 4 times a day. ° °Take acetaminophen as needed for pain. °

## 2020-10-25 NOTE — ED Notes (Signed)
This RN presented the AVS utilizing Teachback Method. Patient verbalizes understanding of Discharge Instructions. Opportunity for Questioning and Answers were provided. Patient Discharged from ED ambulatory to Home.   

## 2021-01-05 ENCOUNTER — Ambulatory Visit (INDEPENDENT_AMBULATORY_CARE_PROVIDER_SITE_OTHER): Payer: Commercial Managed Care - PPO

## 2021-01-05 ENCOUNTER — Other Ambulatory Visit: Payer: Self-pay

## 2021-01-05 ENCOUNTER — Ambulatory Visit: Payer: Commercial Managed Care - PPO | Admitting: Cardiovascular Disease

## 2021-01-05 ENCOUNTER — Encounter: Payer: Self-pay | Admitting: Cardiovascular Disease

## 2021-01-05 DIAGNOSIS — R002 Palpitations: Secondary | ICD-10-CM | POA: Diagnosis not present

## 2021-01-05 DIAGNOSIS — E782 Mixed hyperlipidemia: Secondary | ICD-10-CM | POA: Diagnosis not present

## 2021-01-05 DIAGNOSIS — E785 Hyperlipidemia, unspecified: Secondary | ICD-10-CM | POA: Insufficient documentation

## 2021-01-05 NOTE — Patient Instructions (Addendum)
Medication Instructions:  Your physician recommends that you continue on your current medications as directed. Please refer to the Current Medication list given to you today.  *If you need a refill on your cardiac medications before your next appointment, please call your pharmacy*   Testing/Procedures: Your physician has requested that you have an echocardiogram. Echocardiography is a painless test that uses sound waves to create images of your heart. It provides your doctor with information about the size and shape of your heart and how well your heart's chambers and valves are working. This procedure takes approximately one hour. There are no restrictions for this procedure. This procedure is done at [address]  Dr. Gwenlyn Found has ordered a CT coronary calcium score. This test is done at [address]. This is $99 out of pocket.   Coronary CalciumScan A coronary calcium scan is an imaging test used to look for deposits of calcium and other fatty materials (plaques) in the inner lining of the blood vessels of the heart (coronary arteries). These deposits of calcium and plaques can partly clog and narrow the coronary arteries without producing any symptoms or warning signs. This puts a person at risk for a heart attack. This test can detect these deposits before symptoms develop. Tell a health care provider about: Any allergies you have. All medicines you are taking, including vitamins, herbs, eye drops, creams, and over-the-counter medicines. Any problems you or family members have had with anesthetic medicines. Any blood disorders you have. Any surgeries you have had. Any medical conditions you have. Whether you are pregnant or may be pregnant. What are the risks? Generally, this is a safe procedure. However, problems may occur, including: Harm to a pregnant woman and her unborn baby. This test involves the use of radiation. Radiation exposure can be dangerous to a  pregnant woman and her unborn baby. If you are pregnant, you generally should not have this procedure done. Slight increase in the risk of cancer. This is because of the radiation involved in the test. What happens before the procedure? No preparation is needed for this procedure. What happens during the procedure? You will undress and remove any jewelry around your neck or chest. You will put on a hospital gown. Sticky electrodes will be placed on your chest. The electrodes will be connected to an electrocardiogram (ECG) machine to record a tracing of the electrical activity of your heart. A CT scanner will take pictures of your heart. During this time, you will be asked to lie still and hold your breath for 2-3 seconds while a picture of your heart is being taken. The procedure may vary among health care providers and hospitals. What happens after the procedure? You can get dressed. You can return to your normal activities. It is up to you to get the results of your test. Ask your health care provider, or the department that is doing the test, when your results will be ready. Summary A coronary calcium scan is an imaging test used to look for deposits of calcium and other fatty materials (plaques) in the inner lining of the blood vessels of the heart (coronary arteries). Generally, this is a safe procedure. Tell your health care provider if you are pregnant or may be pregnant. No preparation is needed for this procedure. A CT scanner will take pictures of your heart. You can return to your normal activities after the scan is done. This information is not intended to replace advice given  to you by your health care provider. Make sure you discuss any questions you have with your health care provider. Document Released: 09/14/2007 Document Revised: 02/05/2016 Document Reviewed: 02/05/2016 Elsevier Interactive Patient Education  2017 [place] Term Monitor  Instructions  Your physician has requested you wear a ZIO patch monitor for 14 days.  This is a single patch monitor. [employer] supplies one patch monitor per enrollment. Additional stickers are not available. Please do not apply patch if you will be having a Nuclear Stress Test,  Echocardiogram, Cardiac CT, MRI, or Chest Xray during the period you would be wearing the  monitor. The patch cannot be worn during these tests. You cannot remove and re-apply the  ZIO XT patch monitor.  Your ZIO patch monitor will be mailed 3 day USPS to your address on file. It may take 3-5 days  to receive your monitor after you have been enrolled.  Once you have received your monitor, please review the enclosed instructions. Your monitor  has already been registered assigning a specific monitor serial # to you.  Billing and Patient Assistance Program Information  We have supplied [employer] with any of your insurance information on file for billing purposes. [employer] offers a sliding scale Patient Assistance Program for patients that do not have  insurance, or whose insurance does not completely cover the cost of the ZIO monitor.  You must apply for the Patient Assistance Program to qualify for this discounted rate.  To apply, please call [employer] at [phone], select option 4, select option 2, ask to apply for  Patient Assistance Program. Theodore Demark will ask your household income, and how many people  are in your household. They will quote your out-of-pocket cost based on that information.  [employer] will also be able to set up a 82-month, interest-free payment plan if needed.  Applying the monitor   Shave hair from upper left chest.  Hold abrader disc by orange tab. Rub abrader in 40 strokes over the upper left chest as  indicated in your monitor instructions.  Clean area with 4 enclosed alcohol pads. Let dry.  Apply patch as indicated in monitor instructions. Patch will be placed under collarbone on left  side  of chest with arrow pointing upward.  Rub patch adhesive wings for 2 minutes. Remove white label marked "1". Remove the white  label marked "2". Rub patch adhesive wings for 2 additional minutes.  While looking in a mirror, press and release button in center of patch. A small green light will  flash 3-4 times. This will be your only indicator that the monitor has been turned on.   Do not shower for the first 24 hours. You may shower after the first 24 hours.   Press the button if you feel a symptom. You will hear a small click. Record Date, Time and  Symptom in the Patient Logbook.  When you are ready to remove the patch, follow instructions on the last 2 pages of Patient  Logbook. Stick patch monitor onto the last page of Patient Logbook.  Place Patient Logbook in the blue and white box. Use locking tab on box and tape box closed  securely. The blue and white box has prepaid postage on it. Please place it in the mailbox as  soon as possible. Your physician should have your test results approximately 7 days after the  monitor has been mailed back to [facility].  Call [facility] at [phone] if you have questions  regarding  your ZIO XT patch monitor. Call them immediately if you see an orange light blinking on your  monitor.  If your monitor falls off in less than 4 days, contact our Monitor department at [phone].  If your monitor becomes loose or falls off after 4 days call [employer] at [phone] for  suggestions on securing your monitor   Follow-Up: At [facility], you and your health needs are our priority.  As part of our continuing mission to provide you with exceptional heart care, we have created designated Provider Care Teams.  These Care Teams include your primary Cardiologist (physician) and Advanced Practice Providers (APPs -  Physician Assistants and Nurse Practitioners) who all work together to provide you with the care you need, when you need  it.  We recommend signing up for the patient portal called "MyChart".  Sign up information is provided on this After Visit Summary.  MyChart is used to connect with patients for Virtual Visits (Telemedicine).  Patients are able to view lab/test results, encounter notes, upcoming appointments, etc.  Non-urgent messages can be sent to your provider as well.   To learn more about what you can do with MyChart, go to [email or web address].    Your next appointment:   3 month(s)  The format for your next appointment:   In Person  Provider:   Quay Burow, MD

## 2021-01-05 NOTE — Progress Notes (Unsigned)
Enrolled patient for a 14 day Zio XT  monitor to be mailed to patients home  °

## 2021-01-05 NOTE — Assessment & Plan Note (Signed)
Jean Huber  was referred by Dr. Brigitte Pulse, her PCP for evaluation of palpitations.  She has a long history of Graves' disease status post radioactive iodine ablation 13 years ago.  She was on propranolol as needed for palpitations up until several months ago.  She is on minimal thyroid replacement therapy but still gets daily palpitations.  She says her resting heart rate in the morning when she wakes up is 130 and throughout the day if she exercises is very volatile.  She is never had syncope.  She avoids caffeine or stimulants.  I Jean Huber get a 2D echocardiogram, 2-week Zio patch.  She is working with an endocrinologist.  I will see her back in 3 months.

## 2021-01-05 NOTE — Assessment & Plan Note (Signed)
History of hyperlipidemia not on statin therapy with lipid profile performed 07/25/2020 revealing total cholesterol 188, LDL 114 and HDL 57.  I am going get a coronary calcium score to help assist in determining aggressiveness of risk factor modification.

## 2021-01-05 NOTE — Addendum Note (Signed)
Addended by: Beatrix Fetters on: 01/05/2021 08:36 AM   Modules accepted: Orders

## 2021-01-05 NOTE — Progress Notes (Signed)
01/05/2021 Jean Huber   07/20/1970  [id number]  Primary Physician Jean Huber., MD Primary Cardiologist: Jean Gess MD Jean Huber, Jean Hub[place], [place]  HPI:  Jean Huber is a 50 y.o. mildly overweight married Caucasian female mother of 2 young adult children who live in [place] was referred to me by Dr. Sherryll Huber, her PCP, because of palpitations.  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  She has no cardiac risk factors.  There is no family history of heart disease.  She is never had a heart attack or stroke.  She was diagnosed with Graves' disease approxi-13 years ago where monitor at that time.  She has received radioactive iodine treatment and is seeing an endocrinologist.  She is on minimal thyroid replacement therapy with a TSH that is still very low.  She is avoiding caffeine or stimulants.  She is never had syncope.  She was on the oral beta-blocker until several months ago.  She gets occasional atypical chest pain but denies dyspnea.  She was exercising fairly regularly until she tore her Achilles tendon back in March.   Current Meds  Medication Sig   albuterol (PROVENTIL) (2.5 MG/3ML) 0.083% nebulizer solution Take 2.5 mg by nebulization every 4 (four) hours as needed for wheezing or shortness of breath.    albuterol (VENTOLIN HFA) 108 (90 Base) MCG/ACT inhaler Inhale 1 puff into the lungs every 6 (six) hours as needed for wheezing or shortness of breath.    budesonide (PULMICORT) 0.5 MG/2ML nebulizer solution Take 0.5 mg by nebulization daily.    cyclobenzaprine (FLEXERIL) 10 MG tablet Take 10 mg by mouth every 8 (eight) hours as needed for muscle spasms.    EPINEPHrine 0.3 mg/0.3 mL IJ SOAJ injection Inject 0.3 mg into the muscle as needed for anaphylaxis.    fluticasone (FLONASE) 50 MCG/ACT nasal spray Place 2 sprays into both nostrils.    ibuprofen (ADVIL) 800 MG tablet Take 800 mg by mouth every 8 (eight)  hours as needed for mild pain.    levothyroxine (SYNTHROID) 50 MCG tablet Take 50 mcg by mouth daily.   loratadine (CLARITIN) 10 MG tablet Take 1 tablet (10 mg total) by mouth daily.   Multiple Vitamin (MULTIVITAMIN) capsule Take 1 capsule by mouth daily.   NUCYNTA 50 MG tablet Take 50 mg by mouth 2 (two) times daily as needed for moderate pain.    prochlorperazine (COMPAZINE) 10 MG tablet Take 1 tablet (10 mg total) by mouth every 6 (six) hours as needed for nausea or vomiting.   propranolol (INDERAL) 10 MG tablet Take 10 mg by mouth daily as needed (Chest Palpitations).    Sodium Sulfate-Mag Sulfate-KCl (SUTAB) [phone] MG TABS Take 24 tablets by mouth as directed.   [DISCONTINUED] levothyroxine (SYNTHROID) 100 MCG tablet Take 100 mcg by mouth daily.     Allergies  Allergen Reactions   Doxycycline Hives   Iodinated Casein Anaphylaxis   Ivp Dye [Iodinated Diagnostic Agents] Anaphylaxis   Rocephin [Ceftriaxone] Hives and Nausea And Vomiting   Shellfish Allergy Anaphylaxis   Erythromycin Diarrhea, Nausea And Vomiting and Hives   Diclofenac Hives    Social History   Socioeconomic History   Marital status: Married    Spouse name: Not on file   Number of children: Not on file   Years of education: Not on file   Highest education level: Not on file  Occupational History   Not on file  Tobacco Use   Smoking status: Never   Smokeless tobacco: Never  Substance and Sexual Activity   Alcohol use: Yes    Comment: occ only    Drug use: Never   Sexual activity: Not on file  Other Topics Concern   Not on file  Social History Narrative   Not on file   Social Determinants of Health   Financial Resource Strain: Not on file  Food Insecurity: Not on file  Transportation Needs: Not on file  Physical Activity: Not on file  Stress: Not on file  Social Connections: Not on file  Intimate Partner Violence: Not on file     Review of Systems: General: negative for chills, fever,  night sweats or weight changes.  Cardiovascular: negative for chest pain, dyspnea on exertion, edema, orthopnea, palpitations, paroxysmal nocturnal dyspnea or shortness of breath Dermatological: negative for rash Respiratory: negative for cough or wheezing Urologic: negative for hematuria Abdominal: negative for nausea, vomiting, diarrhea, bright red blood per rectum, melena, or hematemesis Neurologic: negative for visual changes, syncope, or dizziness All other systems reviewed and are otherwise negative except as noted above.    Blood pressure 115/78, pulse 81, height 5\' 5"  (1.651 m), weight 212 lb 6.4 oz (96.3 kg), SpO2 100 %.  General appearance: alert and no distress Neck: no adenopathy, no carotid bruit, no JVD, supple, symmetrical, trachea midline, and thyroid not enlarged, symmetric, no tenderness/mass/nodules Lungs: clear to auscultation bilaterally Heart: regular rate and rhythm, S1, S2 normal, no murmur, click, rub or gallop Extremities: extremities normal, atraumatic, no cyanosis or edema Pulses: 2+ and symmetric Skin: Skin color, texture, turgor normal. No rashes or lesions Neurologic: Grossly normal  EKG sinus rhythm 81 without ST or T wave changes.  I personally reviewed this EKG.  ASSESSMENT AND PLAN:   Palpitations Jean Huber  was referred by Jean Huber, her PCP for evaluation of palpitations.  She has a long history of Graves' disease status post radioactive iodine ablation 13 years ago.  She was on propranolol as needed for palpitations up until several months ago.  She is on minimal thyroid replacement therapy but still gets daily palpitations.  She says her resting heart rate in the morning when she wakes up is 130 and throughout the day if she exercises is very volatile.  She is never had syncope.  She avoids caffeine or stimulants.  I Jean Huber get a 2D echocardiogram, 2-week Zio patch.  She is working with an endocrinologist.  I will see her back in 3  months.  Hyperlipidemia History of hyperlipidemia not on statin therapy with lipid profile performed 07/25/2020 revealing total cholesterol 188, LDL 114 and HDL 57.  I am going get a coronary calcium score to help assist in determining aggressiveness of risk factor modification.     Jean Gess MD FACP,FACC,FAHA, [facility] 01/05/2021 8:23 AM

## 2021-01-13 DIAGNOSIS — R002 Palpitations: Secondary | ICD-10-CM | POA: Diagnosis not present

## 2021-02-02 ENCOUNTER — Inpatient Hospital Stay: Admission: RE | Admit: 2021-02-02 | Payer: Self-pay | Source: Ambulatory Visit

## 2021-02-02 ENCOUNTER — Ambulatory Visit (HOSPITAL_COMMUNITY): Payer: Commercial Managed Care - PPO

## 2021-02-09 ENCOUNTER — Ambulatory Visit (HOSPITAL_COMMUNITY): Payer: Commercial Managed Care - PPO | Attending: Cardiology

## 2021-02-09 ENCOUNTER — Other Ambulatory Visit: Payer: Self-pay

## 2021-02-09 DIAGNOSIS — R002 Palpitations: Secondary | ICD-10-CM

## 2021-02-09 LAB — ECHOCARDIOGRAM COMPLETE
Area-P 1/2: 2.62 cm2
S' Lateral: 3.1 cm

## 2021-02-27 ENCOUNTER — Other Ambulatory Visit: Payer: Self-pay

## 2021-02-27 ENCOUNTER — Ambulatory Visit (INDEPENDENT_AMBULATORY_CARE_PROVIDER_SITE_OTHER)
Admission: RE | Admit: 2021-02-27 | Discharge: 2021-02-27 | Disposition: A | Discharge status: Home / Self Care | Payer: Self-pay | Source: Ambulatory Visit | Attending: Cardiovascular Disease | Admitting: Cardiovascular Disease

## 2021-02-27 DIAGNOSIS — E782 Mixed hyperlipidemia: Secondary | ICD-10-CM

## 2021-03-20 ENCOUNTER — Other Ambulatory Visit: Payer: Self-pay

## 2021-03-20 ENCOUNTER — Encounter (HOSPITAL_COMMUNITY): Payer: Self-pay | Admitting: Emergency Medicine

## 2021-03-20 ENCOUNTER — Ambulatory Visit (HOSPITAL_COMMUNITY)
Admission: EM | Admit: 2021-03-20 | Discharge: 2021-03-20 | Disposition: A | Discharge status: Home / Self Care | Payer: Commercial Managed Care - PPO

## 2021-03-20 DIAGNOSIS — J069 Acute upper respiratory infection, unspecified: Secondary | ICD-10-CM | POA: Diagnosis not present

## 2021-03-20 DIAGNOSIS — R0981 Nasal congestion: Secondary | ICD-10-CM

## 2021-03-20 DIAGNOSIS — J4530 Mild persistent asthma, uncomplicated: Secondary | ICD-10-CM | POA: Diagnosis not present

## 2021-03-20 DIAGNOSIS — R490 Dysphonia: Secondary | ICD-10-CM

## 2021-03-20 MED ORDER — PREDNISONE 20 MG PO TABS: ORAL_TABLET | ORAL | 0 refills | Status: DC

## 2021-03-20 MED ORDER — PROMETHAZINE-DM 6.25-15 MG/5ML PO SYRP: 5.0000 mL | ORAL_SOLUTION | Freq: Every evening | ORAL | 0 refills | Status: DC | PRN

## 2021-03-20 MED ORDER — IPRATROPIUM BROMIDE 0.03 % NA SOLN: 2.0000 | Freq: Two times a day (BID) | NASAL | 0 refills | Status: DC

## 2021-03-20 MED ORDER — BENZONATATE 100 MG PO CAPS: 100.0000 mg | ORAL_CAPSULE | Freq: Three times a day (TID) | ORAL | 0 refills | Status: DC | PRN

## 2021-03-20 MED ORDER — CETIRIZINE HCL 10 MG PO TABS: 10.0000 mg | ORAL_TABLET | Freq: Every day | ORAL | 0 refills | Status: DC

## 2021-03-20 NOTE — ED Provider Notes (Signed)
[name]   MRN: [id number] DOB: 09-08-1970  Subjective:   Jean Huber is a 50 y.o. female presenting for 2-day history of acute onset sinus congestion, sinus pressure, facial pain, productive cough.  Patient has a history of allergies, sinus infections and asthma.  She would like to be considered for a steroid course.  Does not want any respiratory testing including COVID or flu.  No chest pain, shortness of breath, wheezing, body aches, fevers.  No sick contacts to the best of her knowledge.  Has been using DayQuil, Flonase, Mucinex with very temporary relief.  Patient is not a smoker.  No current facility-administered medications for this encounter.  Current Outpatient Medications:    albuterol (PROVENTIL) (2.5 MG/3ML) 0.083% nebulizer solution, Take 2.5 mg by nebulization every 4 (four) hours as needed for wheezing or shortness of breath. , Disp: , Rfl:    albuterol (VENTOLIN HFA) 108 (90 Base) MCG/ACT inhaler, Inhale 1 puff into the lungs every 6 (six) hours as needed for wheezing or shortness of breath. , Disp: , Rfl:    budesonide (PULMICORT) 0.5 MG/2ML nebulizer solution, Take 0.5 mg by nebulization daily. , Disp: , Rfl:    cyclobenzaprine (FLEXERIL) 10 MG tablet, Take 10 mg by mouth every 8 (eight) hours as needed for muscle spasms. , Disp: , Rfl:    EPINEPHrine 0.3 mg/0.3 mL IJ SOAJ injection, Inject 0.3 mg into the muscle as needed for anaphylaxis. , Disp: , Rfl:    fluticasone (FLONASE) 50 MCG/ACT nasal spray, Place 2 sprays into both nostrils. , Disp: , Rfl:    ibuprofen (ADVIL) 800 MG tablet, Take 800 mg by mouth every 8 (eight) hours as needed for mild pain. , Disp: , Rfl:    levothyroxine (SYNTHROID) 50 MCG tablet, Take 50 mcg by mouth daily., Disp: , Rfl:    loratadine (CLARITIN) 10 MG tablet, Take 1 tablet (10 mg total) by mouth daily., Disp: 10 tablet, Rfl: 0   Multiple Vitamin (MULTIVITAMIN) capsule, Take 1 capsule by mouth daily., Disp: , Rfl:     NUCYNTA 50 MG tablet, Take 50 mg by mouth 2 (two) times daily as needed for moderate pain. , Disp: , Rfl:    prochlorperazine (COMPAZINE) 10 MG tablet, Take 1 tablet (10 mg total) by mouth every 6 (six) hours as needed for nausea or vomiting., Disp: 20 tablet, Rfl: 0   propranolol (INDERAL) 10 MG tablet, Take 10 mg by mouth daily as needed (Chest Palpitations). , Disp: , Rfl:    Sodium Sulfate-Mag Sulfate-KCl (SUTAB) [phone] MG TABS, Take 24 tablets by mouth as directed., Disp: 24 tablet, Rfl: 0   Allergies  Allergen Reactions   Doxycycline Hives   Iodinated Casein Anaphylaxis   Ivp Dye [Iodinated Diagnostic Agents] Anaphylaxis   Rocephin [Ceftriaxone] Hives and Nausea And Vomiting   Shellfish Allergy Anaphylaxis   Erythromycin Diarrhea, Nausea And Vomiting and Hives   Diclofenac Hives    Past Medical History:  Diagnosis Date   Allergy    Anemia    past hx    Anxiety    Asthma    Constipation    no meds-    COVID-19 virus infection    05-25-2019   GERD (gastroesophageal reflux disease)    past hx - no meds now    Neuromuscular disorder (HCC)    fibromyalgia    Palpitations    due to Thyroid    Thyroid disease      Past Surgical History:  Procedure Laterality Date   APPENDECTOMY     CARPAL TUNNEL RELEASE Left    CESAREAN SECTION     x 1    COLONOSCOPY     age 16 in [place] due to diarrhea/ constipation    ROTATOR CUFF REPAIR Right    SPINAL FUSION     L4-L5   TONSILLECTOMY      Family History  Problem Relation Age of Onset   Breast cancer Maternal Aunt    Colon cancer Paternal Aunt    Kidney cancer Paternal Grandmother    Bladder Cancer Paternal Grandmother    Pancreatic cancer Paternal Grandfather    Colon cancer Cousin    Colon polyps Neg Hx    Esophageal cancer Neg Hx    Rectal cancer Neg Hx    Stomach cancer Neg Hx     Social History   Tobacco Use   Smoking status: Never   Smokeless tobacco: Never  Substance Use Topics   Alcohol use: Yes     Comment: occ only    Drug use: Never    ROS   Objective:   Vitals: BP 135/82 (BP Location: Left Arm)    Pulse 93    Temp 98.5 F (36.9 C) (Oral)    Resp 16    LMP 07/29/2019    SpO2 99%   Physical Exam Constitutional:      General: She is not in acute distress.    Appearance: Normal appearance. She is well-developed. She is not ill-appearing, toxic-appearing or diaphoretic.  HENT:     Head: Normocephalic and atraumatic.     Right Ear: Tympanic membrane, ear canal and external ear normal. No drainage or tenderness. No middle ear effusion. There is no impacted cerumen. Tympanic membrane is not erythematous.     Left Ear: Tympanic membrane, ear canal and external ear normal. No drainage or tenderness.  No middle ear effusion. There is no impacted cerumen. Tympanic membrane is not erythematous.     Nose: Nose normal. No congestion or rhinorrhea.     Mouth/Throat:     Mouth: Mucous membranes are moist. No oral lesions.     Pharynx: No pharyngeal swelling, oropharyngeal exudate, posterior oropharyngeal erythema or uvula swelling.     Tonsils: No tonsillar exudate or tonsillar abscesses.  Eyes:     Extraocular Movements: Extraocular movements intact.     Right eye: Normal extraocular motion.     Left eye: Normal extraocular motion.     Conjunctiva/sclera: Conjunctivae normal.     Pupils: Pupils are equal, round, and reactive to light.  Cardiovascular:     Rate and Rhythm: Normal rate and regular rhythm.     Pulses: Normal pulses.     Heart sounds: Normal heart sounds. No murmur heard.   No friction rub. No gallop.  Pulmonary:     Effort: Pulmonary effort is normal. No respiratory distress.     Breath sounds: Normal breath sounds. No stridor. No wheezing, rhonchi or rales.  Musculoskeletal:     Cervical back: Normal range of motion and neck supple.  Lymphadenopathy:     Cervical: No cervical adenopathy.  Skin:    General: Skin is warm and dry.     Findings: No rash.   Neurological:     General: No focal deficit present.     Mental Status: She is alert and oriented to person, place, and time.  Psychiatric:        Mood and Affect: Mood normal.  Behavior: Behavior normal.        Thought Content: Thought content normal.    Assessment and Plan :   PDMP not reviewed this encounter.  1. Viral URI with cough   2. Nasal congestion   3. Hoarseness of voice   4. Mild persistent asthma without complication    Provide patient with an oral prednisone course in the context of her allergic rhinitis and asthma.  Otherwise recommend supportive care.  Discussed antibiotic stewardship and we will hold off on antibiotics after 2 days of symptoms.  Patient declined any respiratory testing.  Deferred imaging given clear cardiopulmonary exam, hemodynamically stable vital signs. Counseled patient on potential for adverse effects with medications prescribed/recommended today, ER and return-to-clinic precautions discussed, patient verbalized understanding.    Wallis Bamberg, PA-C 03/20/21 1447

## 2021-03-20 NOTE — ED Triage Notes (Signed)
Patient c/o sinus pressure and nasal congestion x 2 days.   Patient denies fever at home.   Patient endorses headache and productive cough.   Patient has taken Dayquil. Flonase, and Mucinex with no relief of symptoms.

## 2021-04-13 ENCOUNTER — Other Ambulatory Visit: Payer: Self-pay

## 2021-04-13 ENCOUNTER — Ambulatory Visit: Payer: Commercial Managed Care - PPO | Admitting: Cardiovascular Disease

## 2021-04-13 ENCOUNTER — Encounter: Payer: Self-pay | Admitting: Cardiovascular Disease

## 2021-04-13 DIAGNOSIS — R002 Palpitations: Secondary | ICD-10-CM

## 2021-04-13 DIAGNOSIS — E782 Mixed hyperlipidemia: Secondary | ICD-10-CM

## 2021-04-13 MED ORDER — PROPRANOLOL HCL 10 MG PO TABS: 10.0000 mg | ORAL_TABLET | Freq: Every day | ORAL | 3 refills | Status: DC | PRN

## 2021-04-13 NOTE — Progress Notes (Signed)
04/13/2021 Jean Huber   1970-11-03  [id number]  Primary Physician Cleatis Polka., MD Primary Cardiologist: Runell Gess MD Milagros Loll, [place], [place]  HPI:  Jean Huber is a 51 y.o.  mildly overweight married Caucasian female mother of 2 young adult children who live in [place] was referred to me by Dr. Clelia Croft , her PCP, because of palpitations.  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  She has no cardiac risk factors.  There is no family history of heart disease.  She is never had a heart attack or stroke.  She was diagnosed with Graves' disease approxi-13 years ago and wore a monitor at that time.  She has received radioactive iodine treatment and is seeing an endocrinologist.  She is on minimal thyroid replacement therapy with a TSH that is still very low.  She is avoiding caffeine or stimulants.  She is never had syncope.  She was on the oral beta-blocker as needed until several months ago.  She gets occasional atypical chest pain but denies dyspnea.  She was exercising fairly regularly until she tore her Achilles tendon back in March.  Since I saw her back 3 months ago her palpitations have not changed in frequency or severity.  She did mention that she is going through menopause and her symptoms are worse during her menses.  She has eliminated caffeine and stimulants.  I did get a 2D echo which was normal, coronary calcium score which was 0 and an event monitor that showed rare PVCs and short runs of SVT.  She did mention that her as needed beta-blocker did afford her some benefit.   Current Meds  Medication Sig   albuterol (PROVENTIL) (2.5 MG/3ML) 0.083% nebulizer solution Take 2.5 mg by nebulization every 4 (four) hours as needed for wheezing or shortness of breath.    albuterol (VENTOLIN HFA) 108 (90 Base) MCG/ACT inhaler Inhale 1 puff into the lungs every 6 (six) hours as needed for wheezing or shortness of  breath.    budesonide (PULMICORT) 0.5 MG/2ML nebulizer solution Take 0.5 mg by nebulization daily.    cetirizine (ZYRTEC ALLERGY) 10 MG tablet Take 1 tablet (10 mg total) by mouth daily.   cyclobenzaprine (FLEXERIL) 10 MG tablet Take 10 mg by mouth every 8 (eight) hours as needed for muscle spasms.    EPINEPHrine 0.3 mg/0.3 mL IJ SOAJ injection Inject 0.3 mg into the muscle as needed for anaphylaxis.    fluticasone (FLONASE) 50 MCG/ACT nasal spray Place 2 sprays into both nostrils.    levothyroxine (SYNTHROID) 50 MCG tablet Take 50 mcg by mouth daily.   Multiple Vitamin (MULTIVITAMIN) capsule Take 1 capsule by mouth daily.   NUCYNTA 50 MG tablet Take 50 mg by mouth 2 (two) times daily as needed for moderate pain.    VITAMIN D PO Take by mouth.     Allergies  Allergen Reactions   Iodinated Casein Anaphylaxis   Ivp Dye [Iodinated Contrast Media] Anaphylaxis   Rocephin [Ceftriaxone] Hives and Nausea And Vomiting   Shellfish Allergy Anaphylaxis   Erythromycin Diarrhea, Nausea And Vomiting and Hives   Diclofenac Hives    Social History   Socioeconomic History   Marital status: Married    Spouse name: Not on file   Number of children: Not on file   Years of education: Not on file   Highest education level: Not on file  Occupational History   Not  on file  Tobacco Use   Smoking status: Never   Smokeless tobacco: Never  Substance and Sexual Activity   Alcohol use: Yes    Comment: occ only    Drug use: Never   Sexual activity: Not on file  Other Topics Concern   Not on file  Social History Narrative   Not on file   Social Determinants of Health   Financial Resource Strain: Not on file  Food Insecurity: Not on file  Transportation Needs: Not on file  Physical Activity: Not on file  Stress: Not on file  Social Connections: Not on file  Intimate Partner Violence: Not on file     Review of Systems: General: negative for chills, fever, night sweats or weight changes.   Cardiovascular: negative for chest pain, dyspnea on exertion, edema, orthopnea, palpitations, paroxysmal nocturnal dyspnea or shortness of breath Dermatological: negative for rash Respiratory: negative for cough or wheezing Urologic: negative for hematuria Abdominal: negative for nausea, vomiting, diarrhea, bright red blood per rectum, melena, or hematemesis Neurologic: negative for visual changes, syncope, or dizziness All other systems reviewed and are otherwise negative except as noted above.    Blood pressure 110/80, pulse 82, height 5\' 5"  (1.651 m), weight 217 lb (98.4 kg), last menstrual period 07/29/2019, SpO2 99 %.  General appearance: alert and no distress Neck: no adenopathy, no carotid bruit, no JVD, supple, symmetrical, trachea midline, and thyroid not enlarged, symmetric, no tenderness/mass/nodules Lungs: clear to auscultation bilaterally Heart: regular rate and rhythm, S1, S2 normal, no murmur, click, rub or gallop Extremities: extremities normal, atraumatic, no cyanosis or edema Pulses: 2+ and symmetric Skin: Skin color, texture, turgor normal. No rashes or lesions Neurologic: Grossly normal  EKG not performed today  ASSESSMENT AND PLAN:   Palpitations History of palpitations worse during her menstrual cycle/periods.  She is currently going through menopause.  She also has probably mild hyperthyroid with a relatively low TSH and history of Graves' disease in the past.  She avoids stimulants and caffeine.  She was on a as needed beta-blocker in the past but ran out of this.  She found this helpful.  I did do a 2D echo which was entirely normal, an event monitor that showed rare PACs and short runs of SVT and a coronary calcium score of 0.  I have reassured her that her rhythm abnormalities are benign.  Hyperlipidemia History of mild hyperlipidemia lipid profile performed 07/16/2019 revealing total cholesterol 187, LDL 113 and HDL 61.     Runell Gess MD  FACP,FACC,FAHA, [facility] 04/13/2021 9:27 AM

## 2021-04-13 NOTE — Assessment & Plan Note (Signed)
History of palpitations worse during her menstrual cycle/periods.  She is currently going through menopause.  She also has probably mild hyperthyroid with a relatively low TSH and history of Graves' disease in the past.  She avoids stimulants and caffeine.  She was on a as needed beta-blocker in the past but ran out of this.  She found this helpful.  I did do a 2D echo which was entirely normal, an event monitor that showed rare PACs and short runs of SVT and a coronary calcium score of 0.  I have reassured her that her rhythm abnormalities are benign.

## 2021-04-13 NOTE — Assessment & Plan Note (Signed)
History of mild hyperlipidemia lipid profile performed 07/16/2019 revealing total cholesterol 187, LDL 113 and HDL 61.

## 2021-04-13 NOTE — Patient Instructions (Signed)
Medication Instructions:   -Restart propranolol (inderal) 10mg  as needed once daily.  *If you need a refill on your cardiac medications before your next appointment, please call your pharmacy*   Follow-Up: At [facility], you and your health needs are our priority.  As part of our continuing mission to provide you with exceptional heart care, we have created designated Provider Care Teams.  These Care Teams include your primary Cardiologist (physician) and Advanced Practice Providers (APPs -  Physician Assistants and Nurse Practitioners) who all work together to provide you with the care you need, when you need it.  We recommend signing up for the patient portal called "MyChart".  Sign up information is provided on this After Visit Summary.  MyChart is used to connect with patients for Virtual Visits (Telemedicine).  Patients are able to view lab/test results, encounter notes, upcoming appointments, etc.  Non-urgent messages can be sent to your provider as well.   To learn more about what you can do with MyChart, go to [email or web address].    Your next appointment:   12 month(s)  The format for your next appointment:   In Person  Provider:   Quay Burow, MD

## 2021-04-20 MED ORDER — METHYLPREDNISOLONE 4 MG PO TBPK
4 MG | PACK | ORAL | 0 refills | Status: AC
Start: 2021-04-20 — End: ?

## 2021-04-20 MED ORDER — DEXAMETHASONE SOD PHOSPHATE PF 10 MG/ML IJ SOLN
10 MG/ML | Freq: Once | INTRAMUSCULAR | Status: AC
Start: 2021-04-20 — End: 2021-04-20
  Administered 2021-04-20: 21:00:00 10 mg via INTRAMUSCULAR

## 2021-04-20 NOTE — Progress Notes (Signed)
Vickie [name]    Source: Patient appears reliable    Add'l history from: Epic records    Time Patient seen by Provider:3:32 PM     Chief complaint:    Chief Complaint   Patient presents with    Cough    Otalgia     Rt ear    Congestion     Symptoms for 3 days, getting worse each day, taking nyquil, tessalon perles and using nasal spray, doesn't want to test for covid/flu       HISTORY:    Vickie [name] is a 51 y.o. female who presents for evaluation of   Chief Complaint   Patient presents with    Cough    Otalgia     Rt ear    Congestion     Symptoms for 3 days, getting worse each day, taking nyquil, tessalon perles and using nasal spray, doesn't want to test for covid/flu     51 year old female with past medical history as noted now presents with three-day history of cough, nasal congestion, ear discomfort. Partial improvement with OTC NyQuil and Flonase. She has been taking Tessalon Perles with no significant improvement in symptoms. She did have a fever of 101 once which has resolved.  No known exposure to specific illness. Patient declines testing for covid or influenza  She does have a history of asthma and does note intermittent wheezing      Patient's Past Medical History Reviewed and confirmed in epic as below:    Past Surgical History:   Procedure Laterality Date    ACHILLES TENDON SURGERY Right 10/30/2020       Patient's Family History: Reviewed and confirmed in epic as below:    No family history on file.    Patient's Social History: Reviewed and confirmed in epic as below:    Social History     Socioeconomic History    Marital status: Married     Spouse name: None    Number of children: None    Years of education: None    Highest education level: None   Tobacco Use    Smoking status: Never    Smokeless tobacco: Never   Substance and Sexual Activity    Alcohol use: Yes    Drug use: Never       Patient's Allergies Reviewed and confirmed as listed below    Allergies   Allergen Reactions    Ceftriaxone       Other reaction(s): Hives    Shellfish-Derived Products      Other reaction(s): Unknown       Current Medications reviewed and confirmed:    Outpatient Medications Marked as Taking for the 04/20/21 encounter (Office Visit) with Carolina Sink, MD   Medication Sig Dispense Refill    albuterol sulfate HFA (PROVENTIL;VENTOLIN;PROAIR) 108 (90 Base) MCG/ACT inhaler Inhale 1 puff into the lungs every 6 hours as needed      albuterol (PROVENTIL) (2.5 MG/3ML) 0.083% nebulizer solution albuterol sulfate 2.5 mg/3 mL (0.083 %) solution for nebulization      benzonatate (TESSALON) 100 MG capsule       cetirizine (ZYRTEC) 10 MG tablet       diazePAM (VALIUM) 2 MG tablet as needed.      fluticasone (FLONASE) 50 MCG/ACT nasal spray 2 sprays by Nasal route      propranolol (INDERAL) 10 MG tablet       methylPREDNISolone (MEDROL DOSEPACK) 4 MG tablet Administer methylprednisolone Dosepak (4 mg tabs) over  6 days.  See admin instructions. 1 kit 0    Cholecalciferol 50 MCG (2000 UT) TABS 1 tablet Orally Once a day for 30 day(s)      levothyroxine (SYNTHROID) 100 MCG tablet 1 tablet in the morning on an empty stomach Orally Once a day for 30 day(s)         Nursing Notes and Vital Signs reviewed    EXAM:    VITALS: BP 115/79    Pulse 87    Temp 98 ??F (36.7 ??C)    Resp 14    SpO2 99%     GENERAL: alert, well appearing, well nourished, no distress    HEAD: Normocephalic, no masses, lesions, or other abnormalities.    EYE EXAM: normal conjunctiva    EARS: External ears normal, hearing grossly intact.    NOSE: no deformity.    OROPHARYNX: lips and tongue normal. No erythema    HEART: regular rate and rhythm    LUNGS: Normal respiratory rate and normal respiratory effort. Good air movement. Minimal bilateral wheezes on expiratory phase      DIAGNOSIS:    Diagnosis Orders   1. Acute URI  methylPREDNISolone (MEDROL DOSEPACK) 4 MG tablet      2. Mild intermittent asthma with acute exacerbation  dexamethasone (PF) (DECADRON) injection 10 mg     methylPREDNISolone (MEDROL DOSEPACK) 4 MG tablet          PLAN:  1. Acute URI  -     methylPREDNISolone (MEDROL DOSEPACK) 4 MG tablet; Administer methylprednisolone Dosepak (4 mg tabs) over 6 days.  See admin instructions., Disp-1 kit, R-0Normal  2. Mild intermittent asthma with acute exacerbation  -     dexamethasone (PF) (DECADRON) injection 10 mg; 10 mg, IntraMUSCular, ONCE, 1 dose, On Fri 04/20/21 at 1600  -     methylPREDNISolone (MEDROL DOSEPACK) 4 MG tablet; Administer methylprednisolone Dosepak (4 mg tabs) over 6 days.  See admin instructions., Disp-1 kit, R-0Normal      No results found for any visits on 04/20/21.    Current Outpatient Medications   Medication Sig Dispense Refill    albuterol sulfate HFA (PROVENTIL;VENTOLIN;PROAIR) 108 (90 Base) MCG/ACT inhaler Inhale 1 puff into the lungs every 6 hours as needed      albuterol (PROVENTIL) (2.5 MG/3ML) 0.083% nebulizer solution albuterol sulfate 2.5 mg/3 mL (0.083 %) solution for nebulization      benzonatate (TESSALON) 100 MG capsule       cetirizine (ZYRTEC) 10 MG tablet       diazePAM (VALIUM) 2 MG tablet as needed.      fluticasone (FLONASE) 50 MCG/ACT nasal spray 2 sprays by Nasal route      propranolol (INDERAL) 10 MG tablet       methylPREDNISolone (MEDROL DOSEPACK) 4 MG tablet Administer methylprednisolone Dosepak (4 mg tabs) over 6 days.  See admin instructions. 1 kit 0    Cholecalciferol 50 MCG (2000 UT) TABS 1 tablet Orally Once a day for 30 day(s)      levothyroxine (SYNTHROID) 100 MCG tablet 1 tablet in the morning on an empty stomach Orally Once a day for 30 day(s)       Current Facility-Administered Medications   Medication Dose Route Frequency Provider Last Rate Last Admin    dexamethasone (PF) (DECADRON) injection 10 mg  10 mg IntraMUSCular Once Carolina Sink, MD            Orders Placed This Encounter   Medications    dexamethasone (PF) (  DECADRON) injection 10 mg    methylPREDNISolone (MEDROL DOSEPACK) 4 MG tablet     Sig: Administer  methylprednisolone Dosepak (4 mg tabs) over 6 days.  See admin instructions.     Dispense:  1 kit     Refill:  0     Recommend OTC medication as needed for symptom management  Increase by mouth fluids and rest    ADVISED PATIENT TO SEEK MEDICAL ATTENTION FOR SYMPTOMS PERSISTING MORE THAN ONE WEEK OR FOR WORSENING SYMPTOMS SUCH AS FEVER 100.4 OR GREATER FOR MORE THAN 2 DAYS, INCREASED WHEEZING, SHORTNESS OF BREATH, FATIGUE OR CHEST PAIN.  Carolina Sink, MD

## 2021-07-05 ENCOUNTER — Ambulatory Visit: Payer: Commercial Managed Care - PPO | Admitting: Pulmonary Disease

## 2021-07-05 ENCOUNTER — Encounter: Payer: Self-pay | Admitting: Pulmonary Disease

## 2021-07-05 VITALS — BP 120/70 | HR 80 | Temp 97.8°F | Ht 65.0 in | Wt 220.6 lb

## 2021-07-05 DIAGNOSIS — J4540 Moderate persistent asthma, uncomplicated: Secondary | ICD-10-CM | POA: Diagnosis not present

## 2021-07-05 DIAGNOSIS — J329 Chronic sinusitis, unspecified: Secondary | ICD-10-CM | POA: Diagnosis not present

## 2021-07-05 DIAGNOSIS — J302 Other seasonal allergic rhinitis: Secondary | ICD-10-CM | POA: Diagnosis not present

## 2021-07-05 MED ORDER — BREZTRI AEROSPHERE 160-9-4.8 MCG/ACT IN AERO: 2.0000 | INHALATION_SPRAY | Freq: Two times a day (BID) | RESPIRATORY_TRACT | 3 refills | Status: DC

## 2021-07-05 NOTE — Progress Notes (Signed)
? ?Synopsis: Referred in April 2023 for asthma by Willis Modena, NP ? ?Subjective:  ? ?[name] GENDER: female DOB: Oct 25, 1970, MRN: [id number] ? ?Chief Complaint  ?Patient presents with  ? Consult  ?  Consult.   ? ? ?51 year old female, past medical history of anxiety, asthma, COVID-19 in February 2021, GERD, No family history of lung disease or lung cancer. Moved to [place] during covid. Had a pulmonologist in [place]. Over the past month or so she has had increased episodes of bronchitis and increased albuterol inhaler use. She also has a nebulizer. Asthma dx in 2005.has been on Advair, Symbicort. Has been on ICS on spring and fall. Usually less and once per month with albuterol. Spring time she uses her nebs 2 time per week.  From respiratory standpoint she is doing okay.  She just recovered from an exacerbation.  She has had approximately 6-8 exacerbations this past year. ? ? ?Past Medical History:  ?Diagnosis Date  ? Allergy   ? Anemia   ? past hx   ? Anxiety   ? Asthma   ? Constipation   ? no meds-   ? COVID-19 virus infection   ? 05-25-2019  ? GERD (gastroesophageal reflux disease)   ? past hx - no meds now   ? Neuromuscular disorder (HCC)   ? fibromyalgia   ? Palpitations   ? due to Thyroid   ? Thyroid disease   ?  ? ?Family History  ?Problem Relation Age of Onset  ? Breast cancer Maternal Aunt   ? Colon cancer Paternal Aunt   ? Kidney cancer Paternal Grandmother   ? Bladder Cancer Paternal Grandmother   ? Pancreatic cancer Paternal Grandfather   ? Colon cancer Cousin   ? Colon polyps Neg Hx   ? Esophageal cancer Neg Hx   ? Rectal cancer Neg Hx   ? Stomach cancer Neg Hx   ?  ? ?Past Surgical History:  ?Procedure Laterality Date  ? APPENDECTOMY    ? CARPAL TUNNEL RELEASE Left   ? CESAREAN SECTION    ? x 1   ? COLONOSCOPY    ? age 33 in [place] due to diarrhea/ constipation   ? ROTATOR CUFF REPAIR Right   ? SPINAL FUSION    ? L4-L5  ? TONSILLECTOMY    ? ? ?Social History  ? ?Socioeconomic  History  ? Marital status: Married  ?  Spouse name: Not on file  ? Number of children: Not on file  ? Years of education: Not on file  ? Highest education level: Not on file  ?Occupational History  ? Not on file  ?Tobacco Use  ? Smoking status: Never  ? Smokeless tobacco: Never  ?Substance and Sexual Activity  ? Alcohol use: Yes  ?  Comment: occ only   ? Drug use: Never  ? Sexual activity: Not on file  ?Other Topics Concern  ? Not on file  ?Social History Narrative  ? Not on file  ? ?Social Determinants of Health  ? ?Financial Resource Strain: Not on file  ?Food Insecurity: Not on file  ?Transportation Needs: Not on file  ?Physical Activity: Not on file  ?Stress: Not on file  ?Social Connections: Not on file  ?Intimate Partner Violence: Not on file  ?  ? ?Allergies  ?Allergen Reactions  ? Iodinated Casein Anaphylaxis  ? Ivp Dye [Iodinated Contrast Media] Anaphylaxis  ? Rocephin [Ceftriaxone] Hives and Nausea And Vomiting  ? Shellfish Allergy Anaphylaxis  ?  Erythromycin Diarrhea, Nausea And Vomiting and Hives  ? Diclofenac Hives  ?  ? ?Outpatient Medications Prior to Visit  ?Medication Sig Dispense Refill  ? albuterol (PROVENTIL) (2.5 MG/3ML) 0.083% nebulizer solution Take 2.5 mg by nebulization every 4 (four) hours as needed for wheezing or shortness of breath.     ? albuterol (VENTOLIN HFA) 108 (90 Base) MCG/ACT inhaler Inhale 1 puff into the lungs every 6 (six) hours as needed for wheezing or shortness of breath.     ? budesonide (PULMICORT) 0.5 MG/2ML nebulizer solution Take 0.5 mg by nebulization daily.     ? cetirizine (ZYRTEC ALLERGY) 10 MG tablet Take 1 tablet (10 mg total) by mouth daily. 30 tablet 0  ? cyclobenzaprine (FLEXERIL) 10 MG tablet Take 10 mg by mouth every 8 (eight) hours as needed for muscle spasms.     ? EPINEPHrine 0.3 mg/0.3 mL IJ SOAJ injection Inject 0.3 mg into the muscle as needed for anaphylaxis.     ? fluticasone (FLONASE) 50 MCG/ACT nasal spray Place 2 sprays into both nostrils.     ?  ibuprofen (ADVIL) 800 MG tablet Take 800 mg by mouth every 8 (eight) hours as needed for mild pain.    ? ipratropium (ATROVENT) 0.03 % nasal spray Place 2 sprays into both nostrils 2 (two) times daily. 30 mL 0  ? levothyroxine (SYNTHROID) 50 MCG tablet Take 50 mcg by mouth daily.    ? Multiple Vitamin (MULTIVITAMIN) capsule Take 1 capsule by mouth daily.    ? NUCYNTA 50 MG tablet Take 50 mg by mouth 2 (two) times daily as needed for moderate pain.     ? propranolol (INDERAL) 10 MG tablet Take 1 tablet (10 mg total) by mouth daily as needed (Chest Palpitations). 90 tablet 3  ? VITAMIN D PO Take by mouth.    ? benzonatate (TESSALON) 100 MG capsule Take 1-2 capsules (100-200 mg total) by mouth 3 (three) times daily as needed for cough. (Patient not taking: Reported on 04/13/2021) 60 capsule 0  ? loratadine (CLARITIN) 10 MG tablet Take 1 tablet (10 mg total) by mouth daily. (Patient not taking: Reported on 04/13/2021) 10 tablet 0  ? predniSONE (DELTASONE) 20 MG tablet Take 2 tablets daily with breakfast. (Patient not taking: Reported on 04/13/2021) 10 tablet 0  ? prochlorperazine (COMPAZINE) 10 MG tablet Take 1 tablet (10 mg total) by mouth every 6 (six) hours as needed for nausea or vomiting. (Patient not taking: Reported on 04/13/2021) 20 tablet 0  ? promethazine-dextromethorphan (PROMETHAZINE-DM) 6.25-15 MG/5ML syrup Take 5 mLs by mouth at bedtime as needed for cough. (Patient not taking: Reported on 04/13/2021) 100 mL 0  ? Sodium Sulfate-Mag Sulfate-KCl (SUTAB) [phone] MG TABS Take 24 tablets by mouth as directed. (Patient not taking: Reported on 04/13/2021) 24 tablet 0  ? ?No facility-administered medications prior to visit.  ? ? ?Review of Systems  ?Constitutional:  Negative for chills, fever, malaise/fatigue and weight loss.  ?HENT:  Negative for hearing loss, sore throat and tinnitus.   ?Eyes:  Negative for blurred vision and double vision.  ?Respiratory:  Negative for cough, hemoptysis, sputum production,  shortness of breath, wheezing and stridor.   ?Cardiovascular:  Negative for chest pain, palpitations, orthopnea, leg swelling and PND.  ?Gastrointestinal:  Negative for abdominal pain, constipation, diarrhea, heartburn, nausea and vomiting.  ?Genitourinary:  Negative for dysuria, hematuria and urgency.  ?Musculoskeletal:  Negative for joint pain and myalgias.  ?Skin:  Negative for itching and rash.  ?Neurological:  Negative  for dizziness, tingling, weakness and headaches.  ?Endo/Heme/Allergies:  Negative for environmental allergies. Does not bruise/bleed easily.  ?Psychiatric/Behavioral:  Negative for depression. The patient is not nervous/anxious and does not have insomnia.   ?All other systems reviewed and are negative. ? ? ?Objective:  ?Physical Exam ?Vitals reviewed.  ?Constitutional:   ?   General: She is not in acute distress. ?   Appearance: She is well-developed.  ?HENT:  ?   Head: Normocephalic and atraumatic.  ?Eyes:  ?   General: No scleral icterus. ?   Conjunctiva/sclera: Conjunctivae normal.  ?   Pupils: Pupils are equal, round, and reactive to light.  ?Neck:  ?   Vascular: No JVD.  ?   Trachea: No tracheal deviation.  ?Cardiovascular:  ?   Rate and Rhythm: Normal rate and regular rhythm.  ?   Heart sounds: Normal heart sounds. No murmur heard. ?Pulmonary:  ?   Effort: Pulmonary effort is normal. No tachypnea, accessory muscle usage or respiratory distress.  ?   Breath sounds: No stridor. No wheezing, rhonchi or rales.  ?Abdominal:  ?   General: There is no distension.  ?   Palpations: Abdomen is soft.  ?   Tenderness: There is no abdominal tenderness.  ?Musculoskeletal:     ?   General: No tenderness.  ?   Cervical back: Neck supple.  ?Lymphadenopathy:  ?   Cervical: No cervical adenopathy.  ?Skin: ?   General: Skin is warm and dry.  ?   Capillary Refill: Capillary refill takes less than 2 seconds.  ?   Findings: No rash.  ?Neurological:  ?   Mental Status: She is alert and oriented to person, place,  and time.  ?Psychiatric:     ?   Behavior: Behavior normal.  ? ? ? ?Vitals:  ? 07/05/21 1331  ?BP: 120/70  ?Pulse: 80  ?Temp: 97.8 ?F (36.6 ?C)  ?TempSrc: Oral  ?SpO2: 99%  ?Weight: 220 lb 9.6 oz (100.1 kg)

## 2021-07-05 NOTE — Patient Instructions (Signed)
Thank you for visiting Dr. Valeta Harms at [facility]. ?Today we recommend the following: ? ?Continue breztri inhaler, new prescription, copay card.  ? ?PFTS prior to next office.  ? ?Return in about 6 months (around 01/04/2022). ? ? ? ?Please do your part to reduce the spread of COVID-19.  ? ?

## 2021-07-05 NOTE — Addendum Note (Signed)
Addended by: Fran Lowes on: 07/05/2021 03:43 PM ? ? Modules accepted: Orders ? ?

## 2021-10-31 ENCOUNTER — Ambulatory Visit: Payer: Commercial Managed Care - PPO | Admitting: Internal Medicine

## 2021-10-31 ENCOUNTER — Telehealth: Payer: Self-pay | Admitting: Internal Medicine

## 2021-10-31 ENCOUNTER — Encounter: Payer: Self-pay | Admitting: Internal Medicine

## 2021-10-31 VITALS — BP 108/74 | HR 97 | Temp 98.2°F | Ht 64.0 in | Wt 226.6 lb

## 2021-10-31 DIAGNOSIS — D7219 Other eosinophilia: Secondary | ICD-10-CM | POA: Diagnosis not present

## 2021-10-31 DIAGNOSIS — J4541 Moderate persistent asthma with (acute) exacerbation: Secondary | ICD-10-CM

## 2021-10-31 DIAGNOSIS — J4540 Moderate persistent asthma, uncomplicated: Secondary | ICD-10-CM | POA: Diagnosis not present

## 2021-10-31 MED ORDER — HYDROCODONE BIT-HOMATROP MBR 5-1.5 MG/5ML PO SOLN: 5.0000 mL | Freq: Four times a day (QID) | ORAL | 0 refills | Status: DC | PRN

## 2021-10-31 MED ORDER — PREDNISONE 10 MG PO TABS: ORAL_TABLET | ORAL | 0 refills | Status: AC

## 2021-10-31 NOTE — Patient Instructions (Signed)
Please schedule follow up scheduled with APP in 1 months.    Arlyce Harman and Feno to be done with next visit with APP.   Prednisone taper today - sent to your pharmacy  Cough medicine to be used at night sent to your pharmacy.   Please get some blood work on your way out today. This will help [place] understand your asthma better and qualify you for certain treatments.   Continue Breztri 2 puffs twice a day with albuterol nebs as needed - can take these up to 4 times a day while you are recovering from this flare for your asthma.  By learning about asthma and how it can be controlled, you take an important step toward managing this disease. Work closely with your asthma care team to learn all you can about your asthma, how to avoid triggers, what your medications do, and how to take them correctly. With proper care, you can live free of asthma symptoms and maintain a normal, healthy lifestyle.   What is asthma? Asthma is a chronic disease that affects the airways of the lungs. During normal breathing, the bands of muscle that surround the airways are relaxed and air moves freely. During an asthma episode or "attack," there are three main changes that stop air from moving easily through the airways: The bands of muscle that surround the airways tighten and make the airways narrow. This tightening is called bronchospasm.  The lining of the airways becomes swollen or inflamed.  The cells that line the airways produce more mucus, which is thicker than normal and clogs the airways.  These three factors - bronchospasm, inflammation, and mucus production - cause symptoms such as difficulty breathing, wheezing, and coughing.  What are the most common symptoms of asthma? Asthma symptoms are not the same for everyone. They can even change from episode to episode in the same person. Also, you may have only one symptom of asthma, such as cough, but another person may have all the symptoms of asthma. It is important  to know all the symptoms of asthma and to be aware that your asthma can present in any of these ways at any time. The most common symptoms include: Coughing, especially at night  Shortness of breath  Wheezing  Chest tightness, pain, or pressure   Who is affected by asthma? Asthma affects 22 million Americans; about 6 million of these are children under age 35. People who have a family history of asthma have an increased risk of developing the disease. Asthma is also more common in people who have allergies or who are exposed to tobacco smoke. However, anyone can develop asthma at any time. Some people may have asthma all of their lives, while others may develop it as adults.  What causes asthma? The airways in a person with asthma are very sensitive and react to many things, or "triggers." Contact with these triggers causes asthma symptoms. One of the most important parts of asthma control is to identify your triggers and then avoid them when possible. The only trigger you do not want to avoid is exercise. Pre-treatment with medicines before exercise can allow you to stay active yet avoid asthma symptoms. Common asthma triggers include: Infections (colds, viruses, flu, sinus infections)  Exercise  Weather (changes in temperature and/or humidity, cold air)  Tobacco smoke  Allergens (dust mites, pollens, pets, mold spores, cockroaches, and sometimes foods)  Irritants (strong odors from cleaning products, perfume, wood smoke, air pollution)  Strong emotions such as crying  or laughing hard  Some medications   How is asthma diagnosed? To diagnose asthma, your doctor will first review your medical history, family history, and symptoms. Your doctor will want to know any past history of breathing problems you may have had, as well as a family history of asthma, allergies, eczema (a bumpy, itchy skin rash caused by allergies), or other lung disease. It is important that you describe your symptoms in  detail (cough, wheeze, shortness of breath, chest tightness), including when and how often they occur. The doctor will perform a physical examination and listen to your heart and lungs. He or she may also order breathing tests, allergy tests, blood tests, and chest and sinus X-rays. The tests will find out if you do have asthma and if there are any other conditions that are contributing factors.  How is asthma treated? Asthma can be controlled, but not cured. It is not normal to have frequent symptoms, trouble sleeping, or trouble completing tasks. Appropriate asthma care will prevent symptoms and visits to the emergency room and hospital. Asthma medicines are one of the mainstays of asthma treatment. The drugs used to treat asthma are explained below.  Anti-inflammatories: These are the most important drugs for most people with asthma. Anti-inflammatory drugs reduce swelling and mucus production in the airways. As a result, airways are less sensitive and less likely to react to triggers. These medications need to be taken daily and may need to be taken for several weeks before they begin to control asthma. Anti-inflammatory medicines lead to fewer symptoms, better airflow, less sensitive airways, less airway damage, and fewer asthma attacks. If taken every day, they CONTROL or prevent asthma symptoms.   Bronchodilators: These drugs relax the muscle bands that tighten around the airways. This action opens the airways, letting more air in and out of the lungs and improving breathing. Bronchodilators also help clear mucus from the lungs. As the airways open, the mucus moves more freely and can be coughed out more easily. In short-acting forms, bronchodilators RELIEVE or stop asthma symptoms by quickly opening the airways and are very helpful during an asthma episode. In long-acting forms, bronchodilators provide CONTROL of asthma symptoms and prevent asthma episodes.  Asthma drugs can be taken in a variety of  ways. Inhaling the medications by using a metered dose inhaler, dry powder inhaler, or nebulizer is one way of taking asthma medicines. Oral medicines (pills or liquids you swallow) may also be prescribed.  Asthma severity Asthma is classified as either "intermittent" (comes and goes) or "persistent" (lasting). Persistent asthma is further described as being mild, moderate, or severe. The severity of asthma is based on how often you have symptoms both during the day and night, as well as by the results of lung function tests and by how well you can perform activities. The "severity" of asthma refers to how "intense" or "strong" your asthma is.  Asthma control Asthma control is the goal of asthma treatment. Regardless of your asthma severity, it may or may not be controlled. Asthma control means: You are able to do everything you want to do at work and home  You have no (or minimal) asthma symptoms  You do not wake up from your sleep or earlier than usual in the morning due to asthma  You rarely need to use your reliever medicine (inhaler)  Another major part of your treatment is that you are happy with your asthma care and believe your asthma is controlled.  Monitoring symptoms A  key part of treatment is keeping track of how well your lungs are working. Monitoring your symptoms, what they are, how and when they happen, and how severe they are, is an important part of being able to control your asthma.  Sometimes asthma is monitored using a peak flow meter. A peak flow (PF) meter measures how fast the air comes out of your lungs. It can help you know when your asthma is getting worse, sometimes even before you have symptoms. By taking daily peak flow readings, you can learn when to adjust medications to keep asthma under good control. It is also used to create your asthma action plan (see below). Your doctor can use your peak flow readings to adjust your treatment plan in some cases.  Asthma Action  Plan Based on your history and asthma severity, you and your doctor will develop a care plan called an "asthma action plan." The asthma action plan describes when and how to use your medicines, actions to take when asthma worsens, and when to seek emergency care. Make sure you understand this plan. If you do not, ask your asthma care provider any questions you may have. Your asthma action plan is one of the keys to controlling asthma. Keep it readily available to remind you of what you need to do every day to control asthma and what you need to do when symptoms occur.  Goals of asthma therapy These are the goals of asthma treatment: Live an active, normal life  Prevent chronic and troublesome symptoms  Attend work or school every day  Perform daily activities without difficulty  Stop urgent visits to the doctor, emergency department, or hospital  Use and adjust medications to control asthma with few or no side effects

## 2021-10-31 NOTE — Telephone Encounter (Signed)
Called [facility] and was on hold for 10 min Will call back later

## 2021-10-31 NOTE — Progress Notes (Signed)
Jean Huber    [id number]    1970/09/20  Primary Care Physician:Shaw, Netta Corrigan., MD Date of Appointment: 10/31/2021 Established Patient Visit  Chief complaint:   Chief Complaint  Patient presents with   Follow-up    Patient states she is coughing a lot, chest hurts bad, and having a hard time breathing. Cough started about 4 days ago.      HPI: Jean Huber is a 51 y.o.o woman with asthma diagnosed in her late 93s who follows with Dr. Tonia Brooms.   Interval Updates: Here for acute visit for bronchitis. Symptoms started four days ago with fatigue, chest pain and tightness, coughing with sputum production.   Doing albuterol nebs twice a day - doesn't feel its helping. Also doing her breztri.  Cannot identify any sick contacts.   Has been on breztri for 2-3 months. Has been on inhalers in the past for maintenance but was always told to taper off them. Feels breathing has worsened since she had covid.    Current Regimen: Breztri 2 puffs twice a day.  Asthma Triggers: seasonal changes  Exacerbations in the last year:  4-5 times/ in the last year. History of hospitalization or intubation: never.  Allergy Testing: yes in [place]. Says she was allergic to some grasses and molds.  GERD: none Allergic Rhinitis: denies, but she takes zyrtec daily  ACT:  Asthma Control Test ACT Total Score  10/31/2021  9:24 AM 13   FeNO: never had.    I have reviewed the patient's family social and past medical history and updated as appropriate.   Past Medical History:  Diagnosis Date   Allergy    Anemia    past hx    Anxiety    Asthma    Constipation    no meds-    COVID-19 virus infection    05-25-2019   GERD (gastroesophageal reflux disease)    past hx - no meds now    Neuromuscular disorder (HCC)    fibromyalgia    Palpitations    due to Thyroid    Thyroid disease     Past Surgical History:  Procedure Laterality Date   APPENDECTOMY     CARPAL TUNNEL  RELEASE Left    CESAREAN SECTION     x 1    COLONOSCOPY     age 74 in [place] due to diarrhea/ constipation    ROTATOR CUFF REPAIR Right    SPINAL FUSION     L4-L5   TONSILLECTOMY      Family History  Problem Relation Age of Onset   Breast cancer Maternal Aunt    Colon cancer Paternal Aunt    Kidney cancer Paternal Grandmother    Bladder Cancer Paternal Grandmother    Pancreatic cancer Paternal Grandfather    Colon cancer Cousin    Colon polyps Neg Hx    Esophageal cancer Neg Hx    Rectal cancer Neg Hx    Stomach cancer Neg Hx     Social History   Occupational History   Not on file  Tobacco Use   Smoking status: Never   Smokeless tobacco: Never  Substance and Sexual Activity   Alcohol use: Yes    Comment: occ only    Drug use: Never   Sexual activity: Not on file     Physical Exam: Blood pressure 108/74, pulse 97, temperature 98.2 F (36.8 C), temperature source Oral, height 5\' 4"  (1.626 m), weight 226  lb 9.6 oz (102.8 kg), last menstrual period 07/04/2019, SpO2 99 %.  Gen:      No acute distress ENT:  cobblestoning in oropharynx Lungs:    No increased respiratory effort, symmetric chest wall excursion, clear to auscultation bilaterally, no wheezes or crackles CV:         Regular rate and rhythm; no murmurs, rubs, or gallops.  No pedal edema   Data Reviewed: Imaging: I have personally reviewed the CT Chest cardiac scoring Nov 2022  - sub 4mm pulmonary nodules, calcified granuloma  PFTs:    Labs: Absolute eosinophil cough 300 in 2021  Immunization status: Immunization History  Administered Date(s) Administered   Hep A / Hep B 09/02/2012, 12/03/2012, 11/23/2013   Hepatitis A, Adult 09/02/2012, 12/03/2012, 11/23/2013   Influenza Split 12/14/2014, 12/08/2015   Influenza, High Dose Seasonal PF 01/01/2012, 01/16/2013, 12/16/2013   Influenza,inj,Quad PF,6+ Mos 01/19/2019   Influenza,inj,quad, With Preservative 11/08/2014, 11/07/2015, 12/27/2016,  02/16/2018, 02/17/2018   Influenza-Unspecified 12/14/2014, 12/08/2015, 02/16/2018, 03/15/2020   MMR 06/04/2016   PFIZER(Purple Top)SARS-COV-2 Vaccination 11/02/2019, 01/30/2020   Pneumococcal Polysaccharide-23 08/15/2015   Tdap 09/07/2003, 11/23/2013    External Records Personally Reviewed: pulmonary  Assessment:  Moderate persistent asthma with acute exacerbation, poorly controlled Peripheral eosinophilia   Plan/Recommendations: Plan for prednisone taper, cough medicine today.  Will have her follow up with app in 1 month for spiro and feno. Will obtain region 2 allergy panel and eosionphil count today.  Suspect she will be a good candidate for biologic therapy. May need to change breztri to higher dose ICS - multiple courses of prednisone in the last year suggesting poor control.    Return to Care: Return in about 4 weeks (around 11/28/2021).   Durel Salts, MD Pulmonary and Critical Care Medicine [facility] [phone]

## 2021-11-01 NOTE — Telephone Encounter (Signed)
Called [facility] to see if pt was able to get her prednisone Rx. Spoke with [name] who stated that pt was able to pick up the Rx. Nothing further needed.

## 2021-11-02 LAB — RESPIRATORY ALLERGY PROFILE REGION II ~~LOC~~
Allergen, A. alternata, m6: <0.1 kU/L
Allergen, Cedar tree, t12: <0.1 kU/L
Allergen, Comm Silver Birch, t9: <0.1 kU/L
Allergen, Cottonwood, t14: <0.1 kU/L
Allergen, D pternoyssinus,d7: <0.1 kU/L
Allergen, Mouse Urine Protein, e78: <0.1 kU/L
Allergen, Mulberry, t76: <0.1 kU/L
Allergen, Oak,t7: <0.1 kU/L
Allergen, P. notatum, m1: <0.1 kU/L
Aspergillus fumigatus, m3: <0.1 kU/L
Bermuda Grass: <0.1 kU/L
Box Elder IgE: <0.1 kU/L
CLADOSPORIUM HERBARUM (M2) IGE: <0.1 kU/L
COMMON RAGWEED (SHORT) (W1) IGE: 0.4 kU/L — ABNORMAL HIGH
Cat Dander: <0.1 kU/L
Class: 0
Class: 0
Class: 0
Class: 0
Class: 0
Class: 0
Class: 0
Class: 0
Class: 0
Class: 0
Class: 0
Class: 0
Class: 0
Class: 0
Class: 0
Class: 0
Class: 0
Class: 0
Class: 0
Class: 0
Class: 0
Class: 0
Class: 0
Class: 1
Cockroach: <0.1 kU/L
D. farinae: <0.1 kU/L
Dog Dander: <0.1 kU/L
Elm IgE: <0.1 kU/L
IgE (Immunoglobulin E), Serum: 51 kU/L (ref <=114)
Johnson Grass: <0.1 kU/L
Pecan/Hickory Tree IgE: <0.1 kU/L
Rough Pigweed  IgE: <0.1 kU/L
Sheep Sorrel IgE: <0.1 kU/L
Timothy Grass: <0.1 kU/L

## 2021-11-02 LAB — EOSINOPHIL COUNT
Eosinophils Absolute: 283 cells/uL (ref 15–500)
Eosinophils Relative: 4.8 %
WBC: 5.9 10*3/uL (ref 3.8–10.8)

## 2021-11-02 LAB — INTERPRETATION:

## 2021-11-07 ENCOUNTER — Encounter: Payer: Self-pay | Admitting: Pulmonary Disease
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
## 2021-11-09 NOTE — Telephone Encounter (Signed)
Dr. Valeta Harms, please advise if you were talking about 12 months from when the prior scan was performed or 12 months meaning July 2024.

## 2021-11-29 ENCOUNTER — Other Ambulatory Visit: Payer: Self-pay | Admitting: *Deleted

## 2021-11-29 MED ORDER — BREZTRI AEROSPHERE 160-9-4.8 MCG/ACT IN AERO: 2.0000 | INHALATION_SPRAY | Freq: Two times a day (BID) | RESPIRATORY_TRACT | 5 refills | Status: DC

## 2021-12-04 ENCOUNTER — Encounter: Payer: Self-pay | Admitting: Acute Care

## 2021-12-04 ENCOUNTER — Ambulatory Visit: Payer: Commercial Managed Care - PPO | Admitting: Acute Care

## 2021-12-04 ENCOUNTER — Telehealth: Payer: Self-pay | Admitting: Acute Care

## 2021-12-04 VITALS — BP 112/76 | HR 81 | Temp 98.0°F | Ht 64.0 in | Wt 224.2 lb

## 2021-12-04 DIAGNOSIS — J4541 Moderate persistent asthma with (acute) exacerbation: Secondary | ICD-10-CM | POA: Diagnosis not present

## 2021-12-04 DIAGNOSIS — R911 Solitary pulmonary nodule: Secondary | ICD-10-CM

## 2021-12-04 LAB — NITRIC OXIDE: FeNO level (ppb): 11

## 2021-12-04 MED ORDER — ALBUTEROL SULFATE HFA 108 (90 BASE) MCG/ACT IN AERS: 1.0000 | INHALATION_SPRAY | Freq: Four times a day (QID) | RESPIRATORY_TRACT | 5 refills | Status: DC | PRN

## 2021-12-04 MED ORDER — HYDROCOD POLI-CHLORPHE POLI ER 10-8 MG/5ML PO SUER: 5.0000 mL | Freq: Every evening | ORAL | 0 refills | Status: DC | PRN

## 2021-12-04 NOTE — Patient Instructions (Addendum)
It is good to see you today. We will do a FENO today. We will order and schedule PFT's. Follow up after PFT's  We will schedule these today.  CT Chest without contrast in 1 month to re-evaluate 5 mm sub pleural nodule.  We will place an order to pharmacy for [place] for your asthma, and some education about the drug.  Follow up after PFT's with Judson Roch NP or Dr. Valeta Harms.( Has appointment with Dr. Valeta Harms 01/03/2022)  Continue Breztri 2 puffs in the morning and the evening Rinse mouth after use Albuterol as needed for breakthrough shortness of breath. We will send in a refill for your albuterol.  Continue Zyrtec daily as you have been doing.  We will sent in some Tussionex for cough. Use 5 cc only as needed for cough at bedtime.  Please contact office for sooner follow up if symptoms do not improve or worsen or seek emergency care

## 2021-12-04 NOTE — Progress Notes (Signed)
History of Present Illness Jean Huber is a 51 y.o. female with asthma diagnosed in her late 66s who follows with Dr. Valeta Huber.She was last seen by Dr. Shearon Huber for a flare on 8/2.   Current Regimen: Breztri 2 puffs twice a day.  Asthma Triggers: seasonal changes  Exacerbations in the last year:  4-5 times/ in the last year. History of hospitalization or intubation: never.  Allergy Testing: yes in [place]. Says she was allergic to some grasses and molds.  GERD: none Allergic Rhinitis: denies, but she takes zyrtec daily  ACT: 8/2 was 13 ACT 9/5 is 17 FENO is 11 ppb PFT's ordered   12/04/2021 Pt. Presents for follow up. She was seen by Dr. Shearon Huber for an asthma flare on 10/31/2021. She has had 4-5 exacerbations in the last year. She was treated for this exacerbation with a prednisone taper and cough medication and had  region 2 allergy panel and eosionphil count done. They were positive for Sensitivity to ragweed. Borderline elevated eosinophils since over 150 can qualify for fasenra if needed vs dupixent if patient is interested. Plan is for FENO today. We will order PFT's.  She states she completed her prednisone taper. She states she is only so/so better, but when asked if she has needed her rescue she states she has not. . She still has coughing spells especially in the morning and the evening. Mucus is clear. She is compliant with her breztri 2 puffs in the morning and 2 puffs in the evening. She feels this is her fall allergies. She is not having to use her rescue inhaler. She does not feel she has shortness of breath. She is just bothered by the cough. No wheezing.  We discussed her starting on one of the Biologics. We will place the order and have her receive education about taking biologics so she can decide if she wants to start. She has fibromyalgia and Graves disease, so she wants to make a good decision.  ACT remains 17, but she states she is much better and that she does not need any  further prednisone. She is not wheezing. There was notation of a lung nodule, 5 mm sub pleural on  CT chest 09/2020. She would like to have a one year follow up, which we will order. She has follow up with Dr. Valeta Huber   Test Results: Labs 10/31/2021 Region 2 allergy panel and Eosinophila IgE 51, Eosinophils absolute 283  Qualifies for Dupixent or Fasenra     Latest Ref Rng & Units 10/31/2021    9:57 AM 10/24/2020   10:55 PM 04/08/2019    6:08 PM  CBC  WBC 3.8 - 10.8 Thousand/uL 5.9  13.5  8.4   Hemoglobin 12.0 - 15.0 g/dL  14.9  13.7   Hematocrit 36.0 - 46.0 %  45.0  41.6   Platelets 150 - 400 K/uL  208  394        Latest Ref Rng & Units 10/24/2020   10:55 PM 04/08/2019    6:08 PM  BMP  Glucose 70 - 99 mg/dL 100  104   BUN 6 - 20 mg/dL 15  9   Creatinine 0.44 - 1.00 mg/dL 0.73  0.62   Sodium 135 - 145 mmol/L 135  139   Potassium 3.5 - 5.1 mmol/L 4.4  3.8   Chloride 98 - 111 mmol/L 102  106   CO2 22 - 32 mmol/L 20  23   Calcium 8.9 - 10.3 mg/dL 9.7  9.2     BNP No results found for: "BNP"  ProBNP No results found for: "PROBNP"  PFT No results found for: "FEV1PRE", "FEV1POST", "FVCPRE", "FVCPOST", "TLC", "DLCOUNC", "PREFEV1FVCRT", "PSTFEV1FVCRT"  No results found.   Past medical hx Past Medical History:  Diagnosis Date   Allergy    Anemia    past hx    Anxiety    Asthma    Constipation    no meds-    COVID-19 virus infection    05-25-2019   GERD (gastroesophageal reflux disease)    past hx - no meds now    Neuromuscular disorder (HCC)    fibromyalgia    Palpitations    due to Thyroid    Thyroid disease      Social History   Tobacco Use   Smoking status: Never   Smokeless tobacco: Never  Substance Use Topics   Alcohol use: Yes    Comment: occ only    Drug use: Never    [name] reports that she has never smoked. She has never used smokeless tobacco. She reports current alcohol use. She reports that she does not use drugs.  Tobacco Cessation: Never  smoker    Past surgical hx, Family hx, Social hx all reviewed.  Current Outpatient Medications on File Prior to Visit  Medication Sig   albuterol (PROVENTIL) (2.5 MG/3ML) 0.083% nebulizer solution Take 2.5 mg by nebulization every 4 (four) hours as needed for wheezing or shortness of breath.    albuterol (VENTOLIN HFA) 108 (90 Base) MCG/ACT inhaler Inhale 1 puff into the lungs every 6 (six) hours as needed for wheezing or shortness of breath.    Budeson-Glycopyrrol-Formoterol (BREZTRI AEROSPHERE) 160-9-4.8 MCG/ACT AERO Inhale 2 puffs into the lungs in the morning and at bedtime.   cetirizine (ZYRTEC ALLERGY) 10 MG tablet Take 1 tablet (10 mg total) by mouth daily.   cyclobenzaprine (FLEXERIL) 10 MG tablet Take 10 mg by mouth every 8 (eight) hours as needed for muscle spasms.    EPINEPHrine 0.3 mg/0.3 mL IJ SOAJ injection Inject 0.3 mg into the muscle as needed for anaphylaxis.    fluticasone (FLONASE) 50 MCG/ACT nasal spray Place 2 sprays into both nostrils.    ibuprofen (ADVIL) 800 MG tablet Take 800 mg by mouth every 8 (eight) hours as needed for mild pain.   ipratropium (ATROVENT) 0.03 % nasal spray Place 2 sprays into both nostrils 2 (two) times daily.   levothyroxine (SYNTHROID) 75 MCG tablet Take 75 mcg by mouth every morning.   Multiple Vitamin (MULTIVITAMIN) capsule Take 1 capsule by mouth daily.   NUCYNTA 50 MG tablet Take 50 mg by mouth 2 (two) times daily as needed for moderate pain.    prochlorperazine (COMPAZINE) 10 MG tablet Take 1 tablet (10 mg total) by mouth every 6 (six) hours as needed for nausea or vomiting.   propranolol (INDERAL) 10 MG tablet Take 1 tablet (10 mg total) by mouth daily as needed (Chest Palpitations).   VITAMIN D PO Take by mouth.   No current facility-administered medications on file prior to visit.     Allergies  Allergen Reactions   Iodinated Casein Anaphylaxis   Ivp Dye [Iodinated Contrast Media] Anaphylaxis   Rocephin [Ceftriaxone] Hives and  Nausea And Vomiting   Shellfish Allergy Anaphylaxis   Erythromycin Diarrhea, Nausea And Vomiting and Hives   Diclofenac Hives    Review Of Systems:  Constitutional:   No  weight loss, night sweats,  Fevers, chills, fatigue, or  lassitude.  HEENT:   No headaches,  Difficulty swallowing,  Tooth/dental problems, or  Sore throat,                No sneezing, itching, ear ache, nasal congestion, post nasal drip,   CV:  No chest pain,  Orthopnea, PND, swelling in lower extremities, anasarca, dizziness, palpitations, syncope.   GI  No heartburn, indigestion, abdominal pain, nausea, vomiting, diarrhea, change in bowel habits, loss of appetite, bloody stools.   Resp: No shortness of breath with exertion or at rest.  No excess mucus, no productive cough,  + non-productive cough,  No coughing up of blood.  No change in color of mucus.  No wheezing.  No chest wall deformity  Skin: no rash or lesions.  GU: no dysuria, change in color of urine, no urgency or frequency.  No flank pain, no hematuria   MS:  No joint pain or swelling.  No decreased range of motion.  No back pain.  Psych:  No change in mood or affect. No depression or anxiety.  No memory loss.   Vital Signs BP 112/76 (BP Location: Right Arm, Patient Position: Sitting, Cuff Size: Normal)   Pulse 81   Temp 98 F (36.7 C) (Oral)   Ht '5\' 4"'$  (1.626 m)   Wt 224 lb 3.2 oz (101.7 kg)   LMP 07/04/2019 (Approximate) Comment: husband has vascectomy  SpO2 98% Comment: RA  BMI 38.48 kg/m    Physical Exam:  General- No distress,  A&Ox3, pleasant ENT: No sinus tenderness, TM clear, pale nasal mucosa, no oral exudate,+ post nasal drip, no LAN Cardiac: S1, S2, regular rate and rhythm, no murmur Chest: No wheeze/ rales/ dullness; no accessory muscle use, no nasal flaring, no sternal retractions Abd.: Soft Non-tender, ND, BS +, Body mass index is 38.48 kg/m.  Ext: No clubbing cyanosis, edema Neuro:  normal strength, MAE x 4, A&O x  3 Skin: No rashes, warm and dry, no lesions  Psych: normal mood and behavior   Assessment/Plan Assessment:  Moderate persistent asthma with acute exacerbation, poorly controlled Peripheral eosinophilia, Elevated IgE 4 mm pulmonary nodule 01/2021     Plan/Recommendations: We will do a FENO today. We will order and schedule PFT's. Follow up after PFT's  We will schedule these today.  CT Chest without contrast in 1 month to re-evaluate 5 mm sub pleural nodule.  We will place an order to pharmacy for [place] for your asthma, and some education about the drug.  Follow up after PFT's with  Dr. Valeta Huber  01/03/2022 as is scheduled. Continue Breztri 2 puffs in the morning and the evening Rinse mouth after use Albuterol as needed for breakthrough shortness of breath. We will send in a refill for your albuterol.  Continue Zyrtec daily as you have been doing.  We will senf in some Tessionex for cough. Use 5 cc only as needed for cough at bedtime.  Please contact office for sooner follow up if symptoms do not improve or worsen or seek emergency care     I spent 40 minutes dedicated to the care of this patient on the date of this encounter to include pre-visit review of records, face-to-face time with the patient discussing conditions above, post visit ordering of testing, clinical documentation with the electronic health record, making appropriate referrals as documented, and communicating necessary information to the patient's healthcare team.   Magdalen Spatz, NP 12/04/2021  9:37 AM

## 2021-12-04 NOTE — Telephone Encounter (Signed)
Please start paperwork for [place] for this patient. She also would like some additional education on the medication. Thanks so much

## 2021-12-05 ENCOUNTER — Telehealth: Payer: Self-pay | Admitting: Pharmacist

## 2021-12-05 ENCOUNTER — Other Ambulatory Visit (HOSPITAL_COMMUNITY): Payer: Self-pay

## 2021-12-05 NOTE — Telephone Encounter (Signed)
ATC patient to review Dupixent. Will start benefits investigation in separate telephone encounter while we await response from patient  Knox Saliva, PharmD, MPH, BCPS, CPP Clinical Pharmacist (Rheumatology and Pulmonology)

## 2021-12-05 NOTE — Telephone Encounter (Signed)
Received notification from [place] regarding a prior authorization for [place]. Authorization has been APPROVED from 11/05/2021 to 06/03/2022. Approval letter sent to scan center.   Patient must fill through [place]: [phone]  Authorization # [id number]   Dupixent Copay card activated using online portal. Copy of card has been sent to scan center.  RxBIN: [id number] RxPCN: [id number] RxGRP: [id number] ID: [id number]

## 2021-12-05 NOTE — Telephone Encounter (Signed)
Patient will be new start Jean Huber.  Patient would like further education regarding Dupixent prior to starting - ATC patient today but unable to reach.  Submitted a Prior Authorization request to [employer] for [place] via CoverMyMeds. Will update once we receive a response.  Key: [id number]   Patient should be eligible for copay card once approved through insurance.  Knox Saliva, PharmD, MPH, BCPS, CPP Clinical Pharmacist (Rheumatology and Pulmonology)

## 2021-12-10 ENCOUNTER — Other Ambulatory Visit (HOSPITAL_COMMUNITY): Payer: Self-pay

## 2021-12-10 NOTE — Telephone Encounter (Signed)
ATC patient regarding Dupixent. Unable to reach. Left VM with my direct number to discuss  Knox Saliva, PharmD, MPH, BCPS, CPP Clinical Pharmacist (Rheumatology and Pulmonology)

## 2021-12-26 NOTE — Telephone Encounter (Signed)
Opened in error

## 2021-12-26 NOTE — Telephone Encounter (Signed)
I reached out to patient to discuss [place] and provide counseling. She states she was unable to return my previous calls because she lost her phone and was in process of acquiring new phone. She also states she has done significant research on her phone and talked with folks that she knows who are taking [place] and is convinced to try it. She is willing to try a medication that will prevent flare-ups. I reviewed that first dose is completed in clinic with 30 minutes of monitoring but subsequent injections completed at home. 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  We also discussed that literature review of interaction of Graves disease with Dupixent was conducted with no significant difference. She verbalized understanding and would like to move forward with Dupixent.  Patient scheduled for Dupixent new start on 01/03/22 for after her appt with Dr. Valeta Harms that same day  Knox Saliva, PharmD, MPH, BCPS, CPP Clinical Pharmacist (Rheumatology and Pulmonology)

## 2021-12-31 ENCOUNTER — Ambulatory Visit (HOSPITAL_BASED_OUTPATIENT_CLINIC_OR_DEPARTMENT_OTHER)
Admission: RE | Admit: 2021-12-31 | Discharge: 2021-12-31 | Disposition: A | Discharge status: Home / Self Care | Payer: Commercial Managed Care - PPO | Source: Ambulatory Visit | Attending: Acute Care | Admitting: Acute Care

## 2021-12-31 DIAGNOSIS — R911 Solitary pulmonary nodule: Secondary | ICD-10-CM | POA: Diagnosis present

## 2022-01-03 ENCOUNTER — Ambulatory Visit: Payer: Commercial Managed Care - PPO | Admitting: Pulmonary Disease

## 2022-01-03 ENCOUNTER — Ambulatory Visit: Payer: Commercial Managed Care - PPO | Admitting: Pharmacist

## 2022-01-03 ENCOUNTER — Ambulatory Visit (INDEPENDENT_AMBULATORY_CARE_PROVIDER_SITE_OTHER): Payer: Commercial Managed Care - PPO | Admitting: Pulmonary Disease

## 2022-01-03 VITALS — BP 120/80 | HR 85 | Ht 64.0 in | Wt 224.4 lb

## 2022-01-03 DIAGNOSIS — R911 Solitary pulmonary nodule: Secondary | ICD-10-CM | POA: Diagnosis not present

## 2022-01-03 DIAGNOSIS — Z7189 Other specified counseling: Secondary | ICD-10-CM

## 2022-01-03 DIAGNOSIS — J4540 Moderate persistent asthma, uncomplicated: Secondary | ICD-10-CM | POA: Diagnosis not present

## 2022-01-03 DIAGNOSIS — J302 Other seasonal allergic rhinitis: Secondary | ICD-10-CM | POA: Diagnosis not present

## 2022-01-03 DIAGNOSIS — D7219 Other eosinophilia: Secondary | ICD-10-CM

## 2022-01-03 LAB — PULMONARY FUNCTION TEST
DL/VA % pred: 93 %
DL/VA: 3.98 ml/min/mmHg/L
DLCO cor % pred: 95 %
DLCO cor: 19.86 ml/min/mmHg
DLCO unc % pred: 95 %
DLCO unc: 19.86 ml/min/mmHg
FEF 25-75 Post: 3.12 L/sec
FEF 25-75 Pre: 2.14 L/sec
FEF2575-%Change-Post: 45 %
FEF2575-%Pred-Post: 118 %
FEF2575-%Pred-Pre: 81 %
FEV1-%Change-Post: 8 %
FEV1-%Pred-Post: 96 %
FEV1-%Pred-Pre: 88 %
FEV1-Post: 2.63 L
FEV1-Pre: 2.43 L
FEV1FVC-%Change-Post: 2 %
FEV1FVC-%Pred-Pre: 98 %
FEV6-%Change-Post: 5 %
FEV6-%Pred-Post: 96 %
FEV6-%Pred-Pre: 92 %
FEV6-Post: 3.27 L
FEV6-Pre: 3.11 L
FEV6FVC-%Pred-Post: 102 %
FEV6FVC-%Pred-Pre: 102 %
FVC-%Change-Post: 5 %
FVC-%Pred-Post: 94 %
FVC-%Pred-Pre: 89 %
FVC-Post: 3.27 L
FVC-Pre: 3.11 L
Post FEV1/FVC ratio: 80 %
Post FEV6/FVC ratio: 100 %
Pre FEV1/FVC ratio: 78 %
Pre FEV6/FVC Ratio: 100 %
RV % pred: 122 %
RV: 2.27 L
TLC % pred: 107 %
TLC: 5.46 L

## 2022-01-03 MED ORDER — DUPIXENT 300 MG/2ML ~~LOC~~ SOAJ: 300.0000 mg | SUBCUTANEOUS | 1 refills | Status: DC

## 2022-01-03 NOTE — Progress Notes (Signed)
Jean Huber  HPI Patient presents today to [facility] to see pharmacy team for [place] new start.  Past medical history includes moderate persistent asthma, allergic rhinitis, fibromyalgia, Graves' disease, chronic lower back pain. She was initially concerned about possible interaction with starting [place] and her Graves' disease. However, she did her own research after her OV on 12/04/21 and felt comfortable with starting [place] by the time we touched base to schedule new start visit.  She had PFT completed earlier this afternoon and saw Dr. Valeta Harms preceding our visit.   She reports asthma that presents as persistent cough. Since October 2022, she has received 5 prednisone bursts for asthma exacerbations. She does have questions about the  Respiratory Medications Current regimen: Breztri 160-9-4.106mg (2 puffs twice daily), cetirizine 133mdaily  Patient reports no known adherence challenges  OBJECTIVE Allergies  Allergen Reactions   Iodinated Casein Anaphylaxis   Ivp Dye [Iodinated Contrast Media] Anaphylaxis   Rocephin [Ceftriaxone] Hives and Nausea And Vomiting   Shellfish Allergy Anaphylaxis   Erythromycin Diarrhea, Nausea And Vomiting and Hives   Diclofenac Hives    Outpatient Encounter Medications as of 01/03/2022  Medication Sig   albuterol (PROVENTIL) (2.5 MG/3ML) 0.083% nebulizer solution Take 2.5 mg by nebulization every 4 (four) hours as needed for wheezing or shortness of breath.    albuterol (VENTOLIN HFA) 108 (90 Base) MCG/ACT inhaler Inhale 1 puff into the lungs every 6 (six) hours as needed for wheezing or shortness of breath.   Budeson-Glycopyrrol-Formoterol (BREZTRI AEROSPHERE) 160-9-4.8 MCG/ACT AERO Inhale 2 puffs into the lungs in the morning and at bedtime.   cetirizine (ZYRTEC ALLERGY) 10 MG tablet Take 1 tablet (10 mg total) by mouth daily.   chlorpheniramine-HYDROcodone (TUSSIONEX) 10-8 MG/5ML Take 5 mLs by mouth at bedtime as needed for cough. (Patient not  taking: Reported on 01/03/2022)   cyclobenzaprine (FLEXERIL) 10 MG tablet Take 10 mg by mouth every 8 (eight) hours as needed for muscle spasms.    EPINEPHrine 0.3 mg/0.3 mL IJ SOAJ injection Inject 0.3 mg into the muscle as needed for anaphylaxis.    fluticasone (FLONASE) 50 MCG/ACT nasal spray Place 2 sprays into both nostrils.    ibuprofen (ADVIL) 800 MG tablet Take 800 mg by mouth every 8 (eight) hours as needed for mild pain.   ipratropium (ATROVENT) 0.03 % nasal spray Place 2 sprays into both nostrils 2 (two) times daily. (Patient not taking: Reported on 01/03/2022)   levothyroxine (SYNTHROID) 75 MCG tablet Take 75 mcg by mouth every morning.   Multiple Vitamin (MULTIVITAMIN) capsule Take 1 capsule by mouth daily.   NUCYNTA 50 MG tablet Take 50 mg by mouth 2 (two) times daily as needed for moderate pain.    prochlorperazine (COMPAZINE) 10 MG tablet Take 1 tablet (10 mg total) by mouth every 6 (six) hours as needed for nausea or vomiting. (Patient not taking: Reported on 01/03/2022)   propranolol (INDERAL) 10 MG tablet Take 1 tablet (10 mg total) by mouth daily as needed (Chest Palpitations).   VITAMIN D PO Take by mouth.   No facility-administered encounter medications on file as of 01/03/2022.     Immunization History  Administered Date(s) Administered   Hep A / Hep B 09/02/2012, 12/03/2012, 11/23/2013   Hepatitis A, Adult 09/02/2012, 12/03/2012, 11/23/2013   Influenza Split 12/14/2014, 12/08/2015   Influenza, High Dose Seasonal PF 01/01/2012, 01/16/2013, 12/16/2013   Influenza,inj,Quad PF,6+ Mos 01/19/2019   Influenza,inj,quad, With Preservative 11/08/2014, 11/07/2015, 12/27/2016, 02/16/2018, 02/17/2018   Influenza-Unspecified 12/14/2014, 12/08/2015, 02/16/2018, 03/15/2020  MMR 06/04/2016   PFIZER(Purple Top)SARS-COV-2 Vaccination 11/02/2019, 01/30/2020   Pneumococcal Polysaccharide-23 08/15/2015   Tdap 09/07/2003, 11/23/2013     PFTs    Latest Ref Rng & Units 01/03/2022   12:00  PM  PFT Results  FVC-Pre L 3.11  P  FVC-Predicted Pre % 89  P  FVC-Post L 3.27  P  FVC-Predicted Post % 94  P  Pre FEV1/FVC % % 78  P  Post FEV1/FCV % % 80  P  FEV1-Pre L 2.43  P  FEV1-Predicted Pre % 88  P  FEV1-Post L 2.63  P  DLCO uncorrected ml/min/mmHg 19.86  P  DLCO UNC% % 95  P  DLCO corrected ml/min/mmHg 19.86  P  DLCO COR %Predicted % 95  P  DLVA Predicted % 93  P  TLC L 5.46  P  TLC % Predicted % 107  P  RV % Predicted % 122  P    P Preliminary result    Eosinophils Most recent blood eosinophil count was 283 cells/microL taken on 10/31/21 --> previously 300 on 04/08/2019   IgE: 51 on 10/31/21  Assessment   Biologics training for dupilumab (Dupixent)  Goals of therapy: Mechanism: human monoclonal IgG4 antibody that inhibits interleukin-4 and interleukin-13 cytokine-induced responses, including release of proinflammatory cytokines, chemokines, and IgE Reviewed that Dupixent is add-on medication and patient must continue maintenance inhaler regimen. Response to therapy: may take 4 months to determine efficacy. Discussed that patients generally feel improvement sooner than 4 months.  Side effects: injection site reaction (6-18%), antibody development (5-16%), ophthalmic conjunctivitis (2-16%), transient blood eosinophilia (1-2%)  Dose: 631m at Week 0 (administered today in clinic) followed by 3052mevery 14 days thereafter  Administration/Storage:  Reviewed administration sites of thigh or abdomen (at least 2-3 inches away from abdomen). Reviewed the upper arm is only appropriate if caregiver is administering injection  Do not shake pen/syringe as this could lead to product foaming or precipitation. Do not use if solution is discolored or contains particulate matter or if window on prefilled pen is yellow (indicates pen has been used).  Reviewed storage of medication in refrigerator. Reviewed that [place] be stored at room temperature in unopened carton for up to 14  days.  Access: Approval of Dupixent through: insurance Patient enrolled into copay card program to help with copay assistance.  Patient self-administered Dupixent 30061mml x 2 (total dose 600m77mn right upper thigh and left upper thigh using sample Dupixent 300mg28m autoinjector pen NDC: [id number]: 01/30/2024  Patient monitored for 30 minutes for adverse reaction.  Patient tolerated without issue . She did express that injection itself did sting but completed both without issue. Injection site checked and no redness or swelling note. Patient denies itchiness or irritation as well  Medication Reconciliation  A drug regimen assessment was performed, including review of allergies, interactions, disease-state management, dosing and immunization history. Medications were reviewed with the patient, including name, instructions, indication, goals of therapy, potential side effects, importance of adherence, and safe use.  Drug interaction(s): none noted  PLAN Continue Dupixent 300mg 3my 14 days.  Next dose is due 01/17/22 and every 14 days thereafter. Rx sent to: [phone] provided with pharmacy phone number and advised to call later this week to schedule shipment to home. Patient provided with copay card information to provide to pharmacy if quoted copay exceeds $5 per month. Continue maintenance asthma regimen of: Breztri 160-9-4.8mcg (81muffs twice daily)  All questions  encouraged and answered.  Instructed patient to reach out with any further questions or concerns.  Thank you for allowing pharmacy to participate in this patient's care.  This appointment required 45 minutes of patient care (this includes precharting, chart review, review of results, face-to-face care, etc.).  Knox Saliva, PharmD, MPH, BCPS, CPP Clinical Pharmacist (Rheumatology and Pulmonology)

## 2022-01-03 NOTE — Patient Instructions (Signed)
Full PFT Performed Today  

## 2022-01-03 NOTE — Patient Instructions (Signed)
Your next [place] dose is due on 01/17/22, 01/31/22, and every 14 days thereafter  CONTINUE Breztri and cetirizine as prescribed  Your prescription will be shipped from [facility]. Their phone number is [phone] Please call to schedule shipment and confirm address. They will mail your medication to your home.  Your copay should be affordable. If you call the pharmacy and it is not affordable, please double-check that they are billing through your copay card as secondary coverage. That copay card information is: RxBIN: [id number] RxPCN: [id number] RxGRP: [id number] ID: [id number]  You will need to be seen by your provider in 3 to 4 months to assess how [place] is working for you. Please ensure you have a follow-up appointment scheduled in January or February 2023. Call our clinic if you need to make this appointment.  How to manage an injection site reaction: Remember the 5 C's: COUNTER - leave on the counter at least 30 minutes but up to overnight to bring medication to room temperature. This may help prevent stinging COLD - place something cold (like an ice gel pack or cold water bottle) on the injection site just before cleansing with alcohol. This may help reduce pain CLARITIN - use Claritin (generic name is loratadine) for the first two weeks of treatment or the day of, the day before, and the day after injecting. This will help to minimize injection site reactions CORTISONE CREAM - apply if injection site is irritated and itching CALL ME - if injection site reaction is bigger than the size of your fist, looks infected, blisters, or if you develop hives

## 2022-01-03 NOTE — Progress Notes (Signed)
Full PFT Performed Today  

## 2022-01-03 NOTE — Progress Notes (Signed)
Synopsis: Referred in April 2023 for asthma by Cleatis Polka., MD  Subjective:   [name] GENDER: female DOB: 05/07/1970, MRN: [id number]  Chief Complaint  Patient presents with   Follow-up    51 year old female, past medical history of anxiety, asthma, COVID-19 in February 2021, GERD, No family history of lung disease or lung cancer. Moved to [place] during covid. Had a pulmonologist in [place]. Over the past month or so she has had increased episodes of bronchitis and increased albuterol inhaler use. She also has a nebulizer. Asthma dx in 2005.has been on Advair, Symbicort. Has been on ICS on spring and fall. Usually less and once per month with albuterol. Spring time she uses her nebs 2 time per week.  From respiratory standpoint she is doing okay.  She just recovered from an exacerbation.  She has had approximately 6-8 exacerbations this past year.  OV 01/03/2022: Here today for follow-up.  Her asthma has been getting worse.  She recently just had PFTs completed prior to today's office visit.  Her FEV1 FVC ratio is normal.  She has normal spirometry, evidence of air trapping with an RV of 122%, DLCO of 95%.  Patient states that she still having ongoing cough.  She is still using her inhaler regimen Breztri twice a day.  She is planning to start her Dupixent injections which she is actually on get her first injection today.  This was started after being seen by SG, NP in the office a few weeks ago.    Past Medical History:  Diagnosis Date   Allergy    Anemia    past hx    Anxiety    Asthma    Constipation    no meds-    COVID-19 virus infection    05-25-2019   GERD (gastroesophageal reflux disease)    past hx - no meds now    Neuromuscular disorder (HCC)    fibromyalgia    Palpitations    due to Thyroid    Thyroid disease      Family History  Problem Relation Age of Onset   Breast cancer Maternal Aunt    Colon cancer Paternal Aunt    Kidney cancer  Paternal Grandmother    Bladder Cancer Paternal Grandmother    Pancreatic cancer Paternal Grandfather    Colon cancer Cousin    Colon polyps Neg Hx    Esophageal cancer Neg Hx    Rectal cancer Neg Hx    Stomach cancer Neg Hx      Past Surgical History:  Procedure Laterality Date   APPENDECTOMY     CARPAL TUNNEL RELEASE Left    CESAREAN SECTION     x 1    COLONOSCOPY     age 44 in [place] due to diarrhea/ constipation    ROTATOR CUFF REPAIR Right    SPINAL FUSION     L4-L5   TONSILLECTOMY      Social History   Socioeconomic History   Marital status: Married    Spouse name: Not on file   Number of children: Not on file   Years of education: Not on file   Highest education level: Not on file  Occupational History   Not on file  Tobacco Use   Smoking status: Never   Smokeless tobacco: Never  Substance and Sexual Activity   Alcohol use: Yes    Comment: occ only    Drug use: Never   Sexual activity: Not on  file  Other Topics Concern   Not on file  Social History Narrative   Not on file   Social Determinants of Health   Financial Resource Strain: Not on file  Food Insecurity: Not on file  Transportation Needs: Not on file  Physical Activity: Not on file  Stress: Not on file  Social Connections: Not on file  Intimate Partner Violence: Not on file     Allergies  Allergen Reactions   Iodinated Casein Anaphylaxis   Ivp Dye [Iodinated Contrast Media] Anaphylaxis   Rocephin [Ceftriaxone] Hives and Nausea And Vomiting   Shellfish Allergy Anaphylaxis   Erythromycin Diarrhea, Nausea And Vomiting and Hives   Diclofenac Hives     Outpatient Medications Prior to Visit  Medication Sig Dispense Refill   albuterol (PROVENTIL) (2.5 MG/3ML) 0.083% nebulizer solution Take 2.5 mg by nebulization every 4 (four) hours as needed for wheezing or shortness of breath.      albuterol (VENTOLIN HFA) 108 (90 Base) MCG/ACT inhaler Inhale 1 puff into the lungs every 6 (six) hours  as needed for wheezing or shortness of breath. 18 g 5   Budeson-Glycopyrrol-Formoterol (BREZTRI AEROSPHERE) 160-9-4.8 MCG/ACT AERO Inhale 2 puffs into the lungs in the morning and at bedtime. 10.7 g 5   cetirizine (ZYRTEC ALLERGY) 10 MG tablet Take 1 tablet (10 mg total) by mouth daily. 30 tablet 0   fluticasone (FLONASE) 50 MCG/ACT nasal spray Place 2 sprays into both nostrils.      chlorpheniramine-HYDROcodone (TUSSIONEX) 10-8 MG/5ML Take 5 mLs by mouth at bedtime as needed for cough. (Patient not taking: Reported on 01/03/2022) 70 mL 0   cyclobenzaprine (FLEXERIL) 10 MG tablet Take 10 mg by mouth every 8 (eight) hours as needed for muscle spasms.      EPINEPHrine 0.3 mg/0.3 mL IJ SOAJ injection Inject 0.3 mg into the muscle as needed for anaphylaxis.      ibuprofen (ADVIL) 800 MG tablet Take 800 mg by mouth every 8 (eight) hours as needed for mild pain.     ipratropium (ATROVENT) 0.03 % nasal spray Place 2 sprays into both nostrils 2 (two) times daily. (Patient not taking: Reported on 01/03/2022) 30 mL 0   levothyroxine (SYNTHROID) 75 MCG tablet Take 75 mcg by mouth every morning.     Multiple Vitamin (MULTIVITAMIN) capsule Take 1 capsule by mouth daily.     NUCYNTA 50 MG tablet Take 50 mg by mouth 2 (two) times daily as needed for moderate pain.      prochlorperazine (COMPAZINE) 10 MG tablet Take 1 tablet (10 mg total) by mouth every 6 (six) hours as needed for nausea or vomiting. (Patient not taking: Reported on 01/03/2022) 20 tablet 0   propranolol (INDERAL) 10 MG tablet Take 1 tablet (10 mg total) by mouth daily as needed (Chest Palpitations). 90 tablet 3   VITAMIN D PO Take by mouth.     No facility-administered medications prior to visit.    Review of Systems  Constitutional:  Negative for chills, fever, malaise/fatigue and weight loss.  HENT:  Negative for hearing loss, sore throat and tinnitus.   Eyes:  Negative for blurred vision and double vision.  Respiratory:  Positive for cough  and shortness of breath. Negative for hemoptysis, sputum production, wheezing and stridor.   Cardiovascular:  Negative for chest pain, palpitations, orthopnea, leg swelling and PND.  Gastrointestinal:  Negative for abdominal pain, constipation, diarrhea, heartburn, nausea and vomiting.  Genitourinary:  Negative for dysuria, hematuria and urgency.  Musculoskeletal:  Negative  for joint pain and myalgias.  Skin:  Negative for itching and rash.  Neurological:  Negative for dizziness, tingling, weakness and headaches.  Endo/Heme/Allergies:  Negative for environmental allergies. Does not bruise/bleed easily.  Psychiatric/Behavioral:  Negative for depression. The patient is not nervous/anxious and does not have insomnia.   All other systems reviewed and are negative.    Objective:  Physical Exam Vitals reviewed.  Constitutional:      General: She is not in acute distress.    Appearance: She is well-developed.  HENT:     Head: Normocephalic and atraumatic.  Eyes:     General: No scleral icterus.    Conjunctiva/sclera: Conjunctivae normal.     Pupils: Pupils are equal, round, and reactive to light.  Neck:     Vascular: No JVD.     Trachea: No tracheal deviation.  Cardiovascular:     Rate and Rhythm: Normal rate and regular rhythm.     Heart sounds: Normal heart sounds. No murmur heard. Pulmonary:     Effort: Pulmonary effort is normal. No tachypnea or accessory muscle usage.     Breath sounds: No stridor. No wheezing, rhonchi or rales.  Abdominal:     General: There is no distension.     Palpations: Abdomen is soft.     Tenderness: There is no abdominal tenderness.  Musculoskeletal:        General: No tenderness.     Cervical back: Neck supple.  Lymphadenopathy:     Cervical: No cervical adenopathy.  Skin:    General: Skin is warm and dry.     Capillary Refill: Capillary refill takes less than 2 seconds.     Findings: No rash.  Neurological:     Mental Status: She is alert and  oriented to person, place, and time.  Psychiatric:        Behavior: Behavior normal.      Vitals:   01/03/22 1253  BP: 120/80  Pulse: 85  SpO2: 93%  Weight: 224 lb 6.4 oz (101.8 kg)  Height: 5\' 4"  (1.626 m)   93% on RA BMI Readings from Last 3 Encounters:  01/03/22 38.52 kg/m  12/04/21 38.48 kg/m  10/31/21 38.90 kg/m   Wt Readings from Last 3 Encounters:  01/03/22 224 lb 6.4 oz (101.8 kg)  12/04/21 224 lb 3.2 oz (101.7 kg)  10/31/21 226 lb 9.6 oz (102.8 kg)     CBC    Component Value Date/Time   WBC 5.9 10/31/2021 0957   RBC 5.33 (H) 10/24/2020 2255   HGB 14.9 10/24/2020 2255   HCT 45.0 10/24/2020 2255   PLT 208 10/24/2020 2255   MCV 84.4 10/24/2020 2255   MCH 28.0 10/24/2020 2255   MCHC 33.1 10/24/2020 2255   RDW 14.1 10/24/2020 2255   LYMPHSABS 3.1 10/24/2020 2255   MONOABS 0.6 10/24/2020 2255   EOSABS 283 10/31/2021 0957   BASOSABS 0.1 10/24/2020 2255     Chest Imaging: No recent chest imaging  Pulmonary Functions Testing Results:    Latest Ref Rng & Units 01/03/2022   12:00 PM  PFT Results  FVC-Pre L 3.11  P  FVC-Predicted Pre % 89  P  FVC-Post L 3.27  P  FVC-Predicted Post % 94  P  Pre FEV1/FVC % % 78  P  Post FEV1/FCV % % 80  P  FEV1-Pre L 2.43  P  FEV1-Predicted Pre % 88  P  FEV1-Post L 2.63  P  DLCO uncorrected ml/min/mmHg 19.86  P  DLCO  UNC% % 95  P  DLCO corrected ml/min/mmHg 19.86  P  DLCO COR %Predicted % 95  P  DLVA Predicted % 93  P  TLC L 5.46  P  TLC % Predicted % 107  P  RV % Predicted % 122  P    P Preliminary result    FeNO:   Pathology:   Echocardiogram:   Heart Catheterization:     Assessment & Plan:     ICD-10-CM   1. Moderate persistent asthma, unspecified whether complicated  J45.40     2. 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 Seasonal allergies  J30.2     4. Lung nodule  R91.1       Discussion:  This is a 51 year old female longstanding history of recurrent asthma, several year persistent  symptoms.,  Or recurrent seasonal allergies.  Plan: Continue Breztri Continue albuterol as needed. Plans to start Dupixent injections today. Reviewed PFTs today in the office RTC 3 months     Current Outpatient Medications:    albuterol (PROVENTIL) (2.5 MG/3ML) 0.083% nebulizer solution, Take 2.5 mg by nebulization every 4 (four) hours as needed for wheezing or shortness of breath. , Disp: , Rfl:    albuterol (VENTOLIN HFA) 108 (90 Base) MCG/ACT inhaler, Inhale 1 puff into the lungs every 6 (six) hours as needed for wheezing or shortness of breath., Disp: 18 g, Rfl: 5   Budeson-Glycopyrrol-Formoterol (BREZTRI AEROSPHERE) 160-9-4.8 MCG/ACT AERO, Inhale 2 puffs into the lungs in the morning and at bedtime., Disp: 10.7 g, Rfl: 5   cetirizine (ZYRTEC ALLERGY) 10 MG tablet, Take 1 tablet (10 mg total) by mouth daily., Disp: 30 tablet, Rfl: 0   fluticasone (FLONASE) 50 MCG/ACT nasal spray, Place 2 sprays into both nostrils. , Disp: , Rfl:    chlorpheniramine-HYDROcodone (TUSSIONEX) 10-8 MG/5ML, Take 5 mLs by mouth at bedtime as needed for cough. (Patient not taking: Reported on 01/03/2022), Disp: 70 mL, Rfl: 0   cyclobenzaprine (FLEXERIL) 10 MG tablet, Take 10 mg by mouth every 8 (eight) hours as needed for muscle spasms. , Disp: , Rfl:    EPINEPHrine 0.3 mg/0.3 mL IJ SOAJ injection, Inject 0.3 mg into the muscle as needed for anaphylaxis. , Disp: , Rfl:    ibuprofen (ADVIL) 800 MG tablet, Take 800 mg by mouth every 8 (eight) hours as needed for mild pain., Disp: , Rfl:    ipratropium (ATROVENT) 0.03 % nasal spray, Place 2 sprays into both nostrils 2 (two) times daily. (Patient not taking: Reported on 01/03/2022), Disp: 30 mL, Rfl: 0   levothyroxine (SYNTHROID) 75 MCG tablet, Take 75 mcg by mouth every morning., Disp: , Rfl:    Multiple Vitamin (MULTIVITAMIN) capsule, Take 1 capsule by mouth daily., Disp: , Rfl:    NUCYNTA 50 MG tablet, Take 50 mg by mouth 2 (two) times daily as needed for moderate  pain. , Disp: , Rfl:    prochlorperazine (COMPAZINE) 10 MG tablet, Take 1 tablet (10 mg total) by mouth every 6 (six) hours as needed for nausea or vomiting. (Patient not taking: Reported on 01/03/2022), Disp: 20 tablet, Rfl: 0   propranolol (INDERAL) 10 MG tablet, Take 1 tablet (10 mg total) by mouth daily as needed (Chest Palpitations)., Disp: 90 tablet, Rfl: 3   VITAMIN D PO, Take by mouth., Disp: , Rfl:    Josephine Igo, DO [facility] 01/03/2022 1:22 PM

## 2022-01-03 NOTE — Patient Instructions (Addendum)
Thank you for visiting Dr. Valeta Harms at [facility]. Today we recommend the following:  Start dupixent  Continue breztri  Continue as needed albuterol   Return in about 3 months (around 04/05/2022) for with Eric Form, NP, or Dr. Valeta Harms.    Please do your part to reduce the spread of COVID-19.

## 2022-01-08 ENCOUNTER — Ambulatory Visit (HOSPITAL_BASED_OUTPATIENT_CLINIC_OR_DEPARTMENT_OTHER): Payer: Commercial Managed Care - PPO

## 2022-02-11 ENCOUNTER — Encounter: Payer: Self-pay | Admitting: Pulmonary Disease

## 2022-02-11 NOTE — Telephone Encounter (Signed)
Called patient due to nature of message. I was able to get her scheduled to see MW tomorrow at 10am since Dr. Valeta Harms is not available. She verbalized understanding.   Nothing further needed at time of call.

## 2022-02-12 ENCOUNTER — Ambulatory Visit: Payer: Commercial Managed Care - PPO | Admitting: Internal Medicine

## 2022-02-12 ENCOUNTER — Encounter: Payer: Self-pay | Admitting: Internal Medicine

## 2022-02-12 VITALS — BP 124/84 | HR 87 | Temp 98.0°F | Ht 64.0 in | Wt 223.0 lb

## 2022-02-12 DIAGNOSIS — J4541 Moderate persistent asthma with (acute) exacerbation: Secondary | ICD-10-CM

## 2022-02-12 DIAGNOSIS — R058 Other specified cough: Secondary | ICD-10-CM

## 2022-02-12 MED ORDER — METHYLPREDNISOLONE ACETATE 80 MG/ML IJ SUSP
120.0000 mg | Freq: Once | INTRAMUSCULAR | Status: AC
  Administered 2022-02-12: 120 mg via INTRAMUSCULAR

## 2022-02-12 MED ORDER — HYDROCOD POLI-CHLORPHE POLI ER 10-8 MG/5ML PO SUER: 5.0000 mL | Freq: Every evening | ORAL | 0 refills | Status: DC | PRN

## 2022-02-12 MED ORDER — FAMOTIDINE 20 MG PO TABS: ORAL_TABLET | ORAL | 11 refills | Status: DC

## 2022-02-12 MED ORDER — PANTOPRAZOLE SODIUM 40 MG PO TBEC: 40.0000 mg | DELAYED_RELEASE_TABLET | Freq: Every day | ORAL | 2 refills | Status: DC

## 2022-02-12 NOTE — Patient Instructions (Addendum)
Stop breztri for now   For breathing >  Only use your albuterol neb as a rescue medication to be used if you can't catch your breath by resting or doing a relaxed purse lip breathing pattern.  - The less you use it, the better it will work when you need it. - Ok to use up to  every 4 hours if you must but call for immediate appointment if use goes up over your usual need    For cough > tussionex 1 tsp up to every 4 hours until cough gone x 3 days then delsym  2 tsp every 12 hours   For drainage / throat tickle try take CHLORPHENIRAMINE  4 mg  ("Allergy Relief" '4mg'$   at [facility] should be easiest to find in the blue box usually on bottom shelf)  take one every 4 hours as needed - extremely effective and inexpensive over the counter- may cause drowsiness so start with just a dose or two an hour before bedtime and see how you tolerate it before trying in daytime.   Pantoprazole (protonix) 40 mg   Take  30-60 min before first meal of the day and Pepcid (famotidine)  20 mg after supper until return to office - this is the best way to tell whether stomach acid is contributing to your problem.    GERD (REFLUX)  is an extremely common cause of respiratory symptoms just like yours , many times with no obvious heartburn at all.    It can be treated with medication, but also with lifestyle changes including elevation of the head of your bed (ideally with 6 -8inch blocks under the headboard of your bed),  Smoking cessation, avoidance of late meals, excessive alcohol, and avoid fatty foods, chocolate, peppermint, colas, red wine, and acidic juices such as orange juice.  NO MINT OR MENTHOL PRODUCTS SO NO COUGH DROPS  USE SUGARLESS CANDY INSTEAD (Jolley ranchers or Stover's or Life Savers) or even ice chips will also do - the key is to swallow to prevent all throat clearing. NO OIL BASED VITAMINS - use powdered substitutes.  Avoid fish oil when coughing.   Depomedrol 120 mg IM  Keep prior appts -  call sooner if needed

## 2022-02-12 NOTE — Progress Notes (Unsigned)
Jean Huber, female    DOB: [birth year]   MRN: [id number]   Brief patient profile:  57  yowf  never smoker onset seasonal allergy onset in her 30s  seen for  acute eval pulmonary clinic 02/12/2022  for recurrent cough / dx as asthma on prn saba initially then maint rx in 40s none particularly effective and not a candidate for allergy shots per [place] allergist/ breztri started around summer 2023   Dupixent 4th shot due 02/14/22   History of Present Illness  02/12/2022  Pulmonary/ Acute office eval/[facility]  Chief Complaint  Patient presents with   Acute Visit    Cough with green sputum for approx 3 wks. She has had cefdinir and pred per PCP about 2 wks ago. She has increased cough with exertion and also when she lies down.    Dyspnea:  limited more by cough than sob  Cough: was green now mostly clear assoc with pnds  24/7  Sleep: can't sleep in weeks due to cough  SABA use: no better / not using inderal Last neb around 15 h prior to OV Chest discomfort diffuse/ ant with coughing fits only   Urinary incont with cough  Tussionex only thing that helped so far  No obvious day to day or daytime pattern/variability or assoc excess/ purulent sputum or mucus plugs or hemoptysis   or chest tightness, subjective wheeze or overt sinus or hb symptoms.     Also denies any obvious fluctuation of symptoms with weather or environmental changes or other aggravating or alleviating factors except as outlined above   No unusual exposure hx or h/o childhood pna/ asthma or knowledge of premature birth.  Current Allergies, Complete Past Medical History, Past Surgical History, Family History, and Social History were reviewed in [employer] record.  ROS  The following are not active complaints unless bolded Hoarseness, sore throat, dysphagia, dental problems, itching, sneezing,  nasal congestion or discharge of excess mucus or purulent secretions, ear ache,   fever, chills, sweats,  unintended wt loss or wt gain, classically pleuritic or exertional cp,  orthopnea pnd or arm/hand swelling  or leg swelling, presyncope, palpitations, abdominal pain, anorexia, nausea, vomiting, diarrhea  or change in bowel habits or change in bladder habits, change in stools or change in urine, dysuria, hematuria,  rash, arthralgias, visual complaints, headache, numbness, weakness or ataxia or problems with walking or coordination,  change in mood or  memory.           Past Medical History:  Diagnosis Date   Allergy    Anemia    past hx    Anxiety    Asthma    Constipation    no meds-    COVID-19 virus infection    05-25-2019   GERD (gastroesophageal reflux disease)    past hx - no meds now    Neuromuscular disorder (HCC)    fibromyalgia    Palpitations    due to Thyroid    Thyroid disease     Outpatient Medications Prior to Visit  Medication Sig Dispense Refill   albuterol (PROVENTIL) (2.5 MG/3ML) 0.083% nebulizer solution Take 2.5 mg by nebulization every 4 (four) hours as needed for wheezing or shortness of breath.      albuterol (VENTOLIN HFA) 108 (90 Base) MCG/ACT inhaler Inhale 1 puff into the lungs every 6 (six) hours as needed for wheezing or shortness of breath. 18 g 5   Budeson-Glycopyrrol-Formoterol (BREZTRI AEROSPHERE) 160-9-4.8 MCG/ACT AERO Inhale 2  puffs into the lungs in the morning and at bedtime. 10.7 g 5   cetirizine (ZYRTEC ALLERGY) 10 MG tablet Take 1 tablet (10 mg total) by mouth daily. 30 tablet 0   cyclobenzaprine (FLEXERIL) 10 MG tablet Take 10 mg by mouth every 8 (eight) hours as needed for muscle spasms.      Dupilumab (DUPIXENT) 300 MG/2ML SOPN Inject 300 mg into the skin every 14 (fourteen) days. 12 mL 1   EPINEPHrine 0.3 mg/0.3 mL IJ SOAJ injection Inject 0.3 mg into the muscle as needed for anaphylaxis.      fluticasone (FLONASE) 50 MCG/ACT nasal spray Place 2 sprays into both nostrils.      ibuprofen (ADVIL) 800 MG tablet Take 800 mg by mouth every 8  (eight) hours as needed for mild pain.     ipratropium (ATROVENT) 0.03 % nasal spray Place 2 sprays into both nostrils 2 (two) times daily. 30 mL 0   levothyroxine (SYNTHROID) 75 MCG tablet Take 75 mcg by mouth every morning.     Multiple Vitamin (MULTIVITAMIN) capsule Take 1 capsule by mouth daily.     NUCYNTA 50 MG tablet Take 50 mg by mouth 2 (two) times daily as needed for moderate pain.      propranolol (INDERAL) 10 MG tablet Take 1 tablet (10 mg total) by mouth daily as needed (Chest Palpitations). 90 tablet 3   VITAMIN D PO Take by mouth.     chlorpheniramine-HYDROcodone (TUSSIONEX) 10-8 MG/5ML Take 5 mLs by mouth at bedtime as needed for cough. 70 mL 0   prochlorperazine (COMPAZINE) 10 MG tablet Take 1 tablet (10 mg total) by mouth every 6 (six) hours as needed for nausea or vomiting. 20 tablet 0   No facility-administered medications prior to visit.     Objective:     BP 124/84 (BP Location: Left Arm, Cuff Size: Normal)   Pulse 87   Temp 98 F (36.7 C) (Oral)   Ht 5\' 4"  (1.626 m)   Wt 223 lb (101.2 kg)   LMP 07/04/2019 (Approximate) Comment: husband has vascectomy  SpO2 97% Comment: on RA  BMI 38.28 kg/m   SpO2: 97 % (on RA)  Mod obese amb wf classic pseudowheeze / severe dry coughing paroxysms   HEENT : Oropharynx  clear/ no excess pnds     Nasal turbinates nl    NECK :  without  apparent JVD/ palpable Nodes/TM    LUNGS: no acc muscle use,  Nl contour chest which is clear to A and P bilaterally without cough on insp or exp maneuvers   CV:  RRR  no s3 or murmur or increase in P2, and no edema   ABD:  soft and nontender    MS:  Nl gait/ ext warm without deformities Or obvious joint restrictions  calf tenderness, cyanosis or clubbing    SKIN: warm and dry without lesions    NEURO:  alert, approp, nl sensorium with  no motor or cerebellar deficits apparent.    I personally reviewed images and agree with radiology impression as follows:   Chest LDSCT   01/31/22 Scattered solid pulmonary nodules are unchanged in size   follow-up imaging is recommended given greater than 1 year stability.    Assessment   No problem-specific Assessment & Plan notes found for this encounter.     Sandrea Hughs, MD 02/12/2022

## 2022-02-13 ENCOUNTER — Encounter: Payer: Self-pay | Admitting: Internal Medicine

## 2022-02-13 DIAGNOSIS — R058 Other specified cough: Secondary | ICD-10-CM | POA: Insufficient documentation

## 2022-02-13 NOTE — Assessment & Plan Note (Addendum)
Onset  in oct 2022 only responsive to tussionex/prednisone in allergic rhinitis setting pos only to ragweed - Dupixent started 01/03/22 with pft's nl though 200 cc better p saba s rx prior and slt curvature to f/v - cyclical cough rx  40/98/1191 and hold breztri in favor of neb saba prn / continue on dupixent   Upper airway cough syndrome (previously labeled PNDS),  is so named because it's frequently impossible to sort out how much is  CR/sinusitis with freq throat clearing (which can be related to primary GERD)   vs  causing  secondary (" extra esophageal")  GERD from wide swings in gastric pressure that occur with throat clearing, often  promoting self use of mint and menthol lozenges that reduce the lower esophageal sphincter tone and exacerbate the problem further in a cyclical fashion.   These are the same pts (now being labeled as having "irritable larynx syndrome" by some cough centers) who not infrequently have a history of having failed to tolerate ace inhibitors,  dry powder inhalers (or high dose ICS of any type)  or biphosphonates or report having atypical/extraesophageal reflux symptoms that don't respond to standard doses of PPI  and are easily confused as having aecopd or asthma flares by even experienced allergists/ pulmonologists (myself included).   Of the three most common causes of  Sub-acute / recurrent or chronic cough, only one (GERD)  can actually contribute to/ trigger  the other two (asthma and post nasal drip syndrome)  and perpetuate the cylce of cough.  While not intuitively obvious, many patients with chronic low grade reflux do not cough until there is a primary insult that disturbs the protective epithelial barrier and exposes sensitive nerve endings.   This is typically viral but can due to PNDS and latter more likely to  apply here given ragweed allergy   >>>   The point is that once this occurs, it is difficult to eliminate the cycle  using anything but a maximally  effective acid suppression regimen at least in the short run, accompanied by an appropriate diet to address non acid GERD and control / eliminate the cough itself for at least 3 days with tussionex/ eliminate pnds with 1st gen H1 blockers per guidelines and >>> also so added depomedrol 120 mg IM  in case of component of Th-2 driven upper or lower airways inflammation (if cough responds short term only to relapse before return while will on full rx for uacs (as above), then  that would point to allergic rhinitis/ asthma or eos bronchitis as alternative dx)   Medical decision making was a high level of complexity in this pt new to me  because of a chronic condition/diagnosis with severe exacerbation progression possible side effects of treatment  requiring extra time for  H and P, chart review, counseling,  and generating customized AVS unique to this  acute office visit and charting.   Each maintenance medication was reviewed in detail including emphasizing most importantly the difference between maintenance and prns and under what circumstances the prns are to be triggered using an action plan format where appropriate. Please see avs for details which were reviewed in writing by both me and my nurse and patient given a written copy highlighted where appropriate with yellow highlighter for the patient's continued care at home along with an updated version of their medications.  Patient was asked to maintain medication reconciliation by comparing this list to the actual medications being used at home and to  contact this office right away if there is a conflict or discrepancy.

## 2022-04-28 ENCOUNTER — Emergency Department (HOSPITAL_BASED_OUTPATIENT_CLINIC_OR_DEPARTMENT_OTHER): Payer: Commercial Managed Care - PPO

## 2022-04-28 ENCOUNTER — Encounter (HOSPITAL_COMMUNITY): Payer: Self-pay | Admitting: Internal Medicine

## 2022-04-28 ENCOUNTER — Other Ambulatory Visit: Payer: Self-pay

## 2022-04-28 ENCOUNTER — Observation Stay (HOSPITAL_BASED_OUTPATIENT_CLINIC_OR_DEPARTMENT_OTHER)
Admission: EM | Admit: 2022-04-28 | Discharge: 2022-04-30 | Disposition: A | Discharge status: Home / Self Care | Payer: Commercial Managed Care - PPO | Attending: Emergency Medicine | Admitting: Emergency Medicine

## 2022-04-28 ENCOUNTER — Emergency Department (HOSPITAL_BASED_OUTPATIENT_CLINIC_OR_DEPARTMENT_OTHER): Payer: Commercial Managed Care - PPO | Admitting: Radiology

## 2022-04-28 DIAGNOSIS — Z79890 Hormone replacement therapy (postmenopausal): Secondary | ICD-10-CM | POA: Diagnosis not present

## 2022-04-28 DIAGNOSIS — Z981 Arthrodesis status: Secondary | ICD-10-CM

## 2022-04-28 DIAGNOSIS — Z888 Allergy status to other drugs, medicaments and biological substances status: Secondary | ICD-10-CM | POA: Diagnosis not present

## 2022-04-28 DIAGNOSIS — I82412 Acute embolism and thrombosis of left femoral vein: Principal | ICD-10-CM | POA: Insufficient documentation

## 2022-04-28 DIAGNOSIS — Z91013 Allergy to seafood: Secondary | ICD-10-CM

## 2022-04-28 DIAGNOSIS — O2230 Deep phlebothrombosis in pregnancy, unspecified trimester: Secondary | ICD-10-CM | POA: Diagnosis present

## 2022-04-28 DIAGNOSIS — M79652 Pain in left thigh: Secondary | ICD-10-CM | POA: Diagnosis present

## 2022-04-28 DIAGNOSIS — T886XXA Anaphylactic reaction due to adverse effect of correct drug or medicament properly administered, initial encounter: Secondary | ICD-10-CM | POA: Diagnosis not present

## 2022-04-28 DIAGNOSIS — K219 Gastro-esophageal reflux disease without esophagitis: Secondary | ICD-10-CM | POA: Diagnosis present

## 2022-04-28 DIAGNOSIS — I2699 Other pulmonary embolism without acute cor pulmonale: Secondary | ICD-10-CM | POA: Insufficient documentation

## 2022-04-28 DIAGNOSIS — J45909 Unspecified asthma, uncomplicated: Secondary | ICD-10-CM | POA: Diagnosis not present

## 2022-04-28 DIAGNOSIS — Z91041 Radiographic dye allergy status: Secondary | ICD-10-CM | POA: Diagnosis not present

## 2022-04-28 DIAGNOSIS — Z8616 Personal history of COVID-19: Secondary | ICD-10-CM | POA: Insufficient documentation

## 2022-04-28 DIAGNOSIS — E669 Obesity, unspecified: Secondary | ICD-10-CM | POA: Diagnosis not present

## 2022-04-28 DIAGNOSIS — Z79899 Other long term (current) drug therapy: Secondary | ICD-10-CM | POA: Insufficient documentation

## 2022-04-28 DIAGNOSIS — Z881 Allergy status to other antibiotic agents status: Secondary | ICD-10-CM | POA: Diagnosis not present

## 2022-04-28 DIAGNOSIS — Z6836 Body mass index (BMI) 36.0-36.9, adult: Secondary | ICD-10-CM

## 2022-04-28 DIAGNOSIS — I82432 Acute embolism and thrombosis of left popliteal vein: Secondary | ICD-10-CM | POA: Diagnosis not present

## 2022-04-28 DIAGNOSIS — Z87892 Personal history of anaphylaxis: Secondary | ICD-10-CM

## 2022-04-28 DIAGNOSIS — I82409 Acute embolism and thrombosis of unspecified deep veins of unspecified lower extremity: Secondary | ICD-10-CM | POA: Diagnosis present

## 2022-04-28 DIAGNOSIS — I824Z2 Acute embolism and thrombosis of unspecified deep veins of left distal lower extremity: Secondary | ICD-10-CM

## 2022-04-28 DIAGNOSIS — E039 Hypothyroidism, unspecified: Secondary | ICD-10-CM | POA: Insufficient documentation

## 2022-04-28 LAB — BASIC METABOLIC PANEL
Anion gap: 9 (ref 5–15)
BUN: 16 mg/dL (ref 6–20)
CO2: 26 mmol/L (ref 22–32)
Calcium: 9.6 mg/dL (ref 8.9–10.3)
Chloride: 104 mmol/L (ref 98–111)
Creatinine, Ser: 0.75 mg/dL (ref 0.44–1.00)
GFR, Estimated: >60 mL/min (ref >60)
Glucose, Bld: 97 mg/dL (ref 70–99)
Potassium: 3.9 mmol/L (ref 3.5–5.1)
Sodium: 139 mmol/L (ref 135–145)

## 2022-04-28 LAB — CBC
HCT: 38.8 % (ref 36.0–46.0)
Hemoglobin: 13 g/dL (ref 12.0–15.0)
MCH: 28.5 pg (ref 26.0–34.0)
MCHC: 33.5 g/dL (ref 30.0–36.0)
MCV: 85.1 fL (ref 80.0–100.0)
Platelets: 216 10*3/uL (ref 150–400)
RBC: 4.56 MIL/uL (ref 3.87–5.11)
RDW: 14.3 % (ref 11.5–15.5)
WBC: 9.8 10*3/uL (ref 4.0–10.5)
nRBC: 0 % (ref 0.0–0.2)

## 2022-04-28 LAB — HEPARIN LEVEL (UNFRACTIONATED)
Heparin Unfractionated: >1.1 IU/mL — ABNORMAL HIGH (ref 0.30–0.70)
Heparin Unfractionated: >1.1 IU/mL — ABNORMAL HIGH (ref 0.30–0.70)

## 2022-04-28 LAB — TROPONIN I (HIGH SENSITIVITY): Troponin I (High Sensitivity): <2 ng/L (ref <18)

## 2022-04-28 MED ORDER — MULTIVITAMINS PO CAPS: 1.0000 | ORAL_CAPSULE | Freq: Every day | ORAL | Status: DC

## 2022-04-28 MED ORDER — ALBUTEROL SULFATE (2.5 MG/3ML) 0.083% IN NEBU: 2.5000 mg | INHALATION_SOLUTION | RESPIRATORY_TRACT | Status: DC | PRN

## 2022-04-28 MED ORDER — HEPARIN BOLUS VIA INFUSION
5500.0000 [IU] | Freq: Once | INTRAVENOUS | Status: AC
  Administered 2022-04-28: 5500 [IU] via INTRAVENOUS

## 2022-04-28 MED ORDER — SENNOSIDES-DOCUSATE SODIUM 8.6-50 MG PO TABS
1.0000 | ORAL_TABLET | Freq: Every day | ORAL | Status: DC
  Administered 2022-04-28 – 2022-04-29 (×2): 1 via ORAL
  Filled 2022-04-28 (×2): qty 1

## 2022-04-28 MED ORDER — HYDROMORPHONE HCL 1 MG/ML IJ SOLN
0.5000 mg | INTRAMUSCULAR | Status: DC | PRN
  Administered 2022-04-29: 0.5 mg via INTRAVENOUS
  Filled 2022-04-28: qty 1

## 2022-04-28 MED ORDER — ADULT MULTIVITAMIN W/MINERALS CH
1.0000 | ORAL_TABLET | Freq: Every day | ORAL | Status: DC
  Administered 2022-04-30: 1 via ORAL
  Filled 2022-04-28 (×2): qty 1

## 2022-04-28 MED ORDER — HEPARIN (PORCINE) 25000 UT/250ML-% IV SOLN
1400.0000 [IU]/h | INTRAVENOUS | Status: DC
  Administered 2022-04-28: 1400 [IU]/h via INTRAVENOUS
  Filled 2022-04-28: qty 250

## 2022-04-28 MED ORDER — HEPARIN (PORCINE) 25000 UT/250ML-% IV SOLN
1200.0000 [IU]/h | INTRAVENOUS | Status: DC
  Administered 2022-04-29 (×2): 1200 [IU]/h via INTRAVENOUS
  Filled 2022-04-28: qty 250

## 2022-04-28 MED ORDER — PROCHLORPERAZINE EDISYLATE 10 MG/2ML IJ SOLN
5.0000 mg | Freq: Four times a day (QID) | INTRAMUSCULAR | Status: DC | PRN
  Administered 2022-04-29: 5 mg via INTRAVENOUS
  Filled 2022-04-28: qty 2

## 2022-04-28 MED ORDER — HYDROMORPHONE HCL 1 MG/ML IJ SOLN
0.5000 mg | INTRAMUSCULAR | Status: AC
  Administered 2022-04-28: 0.5 mg via INTRAVENOUS
  Filled 2022-04-28: qty 1

## 2022-04-28 MED ORDER — ALBUTEROL SULFATE HFA 108 (90 BASE) MCG/ACT IN AERS: 2.0000 | INHALATION_SPRAY | RESPIRATORY_TRACT | Status: DC | PRN

## 2022-04-28 MED ORDER — PANTOPRAZOLE SODIUM 40 MG PO TBEC
40.0000 mg | DELAYED_RELEASE_TABLET | Freq: Every day | ORAL | Status: DC
  Administered 2022-04-28 – 2022-04-30 (×3): 40 mg via ORAL
  Filled 2022-04-28 (×3): qty 1

## 2022-04-28 MED ORDER — LEVOTHYROXINE SODIUM 75 MCG PO TABS
75.0000 ug | ORAL_TABLET | Freq: Every morning | ORAL | Status: DC
  Administered 2022-04-29 – 2022-04-30 (×2): 75 ug via ORAL
  Filled 2022-04-28 (×2): qty 1

## 2022-04-28 MED ORDER — LORATADINE 10 MG PO TABS
10.0000 mg | ORAL_TABLET | Freq: Every day | ORAL | Status: DC
  Administered 2022-04-28 – 2022-04-30 (×2): 10 mg via ORAL
  Filled 2022-04-28 (×3): qty 1

## 2022-04-28 MED ORDER — MORPHINE SULFATE (PF) 4 MG/ML IV SOLN: 4.0000 mg | Freq: Once | INTRAVENOUS | Status: DC | PRN

## 2022-04-28 MED ORDER — MELATONIN 5 MG PO TABS
5.0000 mg | ORAL_TABLET | Freq: Every evening | ORAL | Status: DC | PRN
  Filled 2022-04-28: qty 1

## 2022-04-28 MED ORDER — ACETAMINOPHEN 325 MG PO TABS
650.0000 mg | ORAL_TABLET | Freq: Four times a day (QID) | ORAL | Status: DC | PRN
  Administered 2022-04-30: 650 mg via ORAL
  Filled 2022-04-28: qty 2

## 2022-04-28 MED ORDER — FAMOTIDINE 20 MG PO TABS
40.0000 mg | ORAL_TABLET | Freq: Every day | ORAL | Status: DC
  Administered 2022-04-28 – 2022-04-30 (×3): 40 mg via ORAL
  Filled 2022-04-28 (×3): qty 2

## 2022-04-28 MED ORDER — OXYCODONE HCL 5 MG PO TABS
5.0000 mg | ORAL_TABLET | ORAL | Status: DC | PRN
  Administered 2022-04-29 – 2022-04-30 (×5): 5 mg via ORAL
  Filled 2022-04-28 (×5): qty 1

## 2022-04-28 MED ORDER — MORPHINE SULFATE (PF) 4 MG/ML IV SOLN
4.0000 mg | Freq: Once | INTRAVENOUS | Status: AC
  Administered 2022-04-28: 4 mg via INTRAVENOUS
  Filled 2022-04-28: qty 1

## 2022-04-28 MED ORDER — IPRATROPIUM BROMIDE 0.06 % NA SOLN
2.0000 | Freq: Two times a day (BID) | NASAL | Status: DC
  Filled 2022-04-28 (×2): qty 15

## 2022-04-28 MED ORDER — POLYETHYLENE GLYCOL 3350 17 G PO PACK: 17.0000 g | PACK | Freq: Every day | ORAL | Status: DC | PRN

## 2022-04-28 NOTE — ED Notes (Signed)
The patient stated she did not need or want an MDI treatment at this time. Patient has a HX Asthma. She appears to be in no distress at this time. RT will continue to monitor patient while she is here at [facility]

## 2022-04-28 NOTE — Progress Notes (Signed)
ANTICOAGULATION CONSULT NOTE  Pharmacy Consult for heparin Indication: DVT  Allergies  Allergen Reactions   Iodinated Casein Anaphylaxis   Ivp Dye [Iodinated Contrast Media] Anaphylaxis   Rocephin [Ceftriaxone] Hives and Nausea And Vomiting   Shellfish Allergy Anaphylaxis   Erythromycin Diarrhea, Nausea And Vomiting and Hives   Diclofenac Hives    Patient Measurements: Height: '5\' 4"'$  (162.6 cm) Weight: 101.2 kg (223 lb 1.7 oz) IBW/kg (Calculated) : 54.7 Heparin Dosing Weight: 82kg  Vital Signs: Temp: 98.1 F (36.7 C) (01/28 2326) Temp Source: Oral (01/28 2326) BP: 153/78 (01/28 2326) Pulse Rate: 86 (01/28 2326)  Labs: Recent Labs    04/28/22 1554 04/28/22 1730 04/28/22 2225 04/28/22 2324  HGB 13.0  --   --   --   HCT 38.8  --   --   --   PLT 216  --   --   --   HEPARINUNFRC  --   --  >1.10* >1.10*  CREATININE 0.75  --   --   --   TROPONINIHS  --  <2  --   --      Estimated Creatinine Clearance: 94.1 mL/min (by C-G formula based on SCr of 0.75 mg/dL).   Medical History: Past Medical History:  Diagnosis Date   Allergy    Anemia    past hx    Anxiety    Asthma    Constipation    no meds-    COVID-19 virus infection    05-25-2019   GERD (gastroesophageal reflux disease)    past hx - no meds now    Neuromuscular disorder (HCC)    fibromyalgia    Palpitations    due to Thyroid    Thyroid disease     Assessment: 54 YOF presenting with leg pain and SOB, doppler + for DVT, PE study pending, she is not on anticoagulation PTA  1/28 PM update:  Heparin level elevated Confirmed it was drawn from opposite arm  Goal of Therapy:  Heparin level 0.3-0.7 units/ml Monitor platelets by anticoagulation protocol: Yes   Plan:  Hold heparin x 1 hr Re-start heparin at 1200 units/hr  0900 heparin level  Narda Bonds, PharmD, BCPS Clinical Pharmacist Phone: [phone]

## 2022-04-28 NOTE — ED Notes (Signed)
Kim with [facility] called for transport

## 2022-04-28 NOTE — ED Triage Notes (Addendum)
Patient arrives with complaints of increased pain to left thigh that has started to impair her mobility. Patient also reports increased swelling site left leg as well.  increased shortness of breath with ambulation.

## 2022-04-28 NOTE — ED Provider Notes (Signed)
[facility] Provider Note   CSN: [id number] Arrival date & time: 04/28/22  1348     History  Chief Complaint  Patient presents with   Leg Pain   Leg Swelling    left   Shortness of Breath    Jean Huber is a 52 y.o. female.   Leg Pain Shortness of Breath    Patient presented to the ED with complaints of left thigh pain.  Patient states she started having sharp pain in the left thigh a few days ago.  He denies any recent falls or injuries.  She has palpable tenderness in her inner left thigh.  Patient thought maybe she pulled a muscle but did not recall doing anything so she was taking over-the-counter medications without much relief.  Today however she started to notice that her leg appeared swollen.  She has felt mildly short of breath.  She is not having any chest pain.  No history of DVT or PE  Home Medications Prior to Admission medications   Medication Sig Start Date End Date Taking? Authorizing Provider  albuterol (PROVENTIL) (2.5 MG/3ML) 0.083% nebulizer solution Take 2.5 mg by nebulization every 4 (four) hours as needed for wheezing or shortness of breath.  05/13/16   [provider]  cetirizine (ZYRTEC ALLERGY) 10 MG tablet Take 1 tablet (10 mg total) by mouth daily. 03/20/21   Jaynee Eagles, PA-C  chlorpheniramine-HYDROcodone (TUSSIONEX) 10-8 MG/5ML Take 5 mLs by mouth at bedtime as needed for cough. 02/12/22   Tanda Rockers, MD  cyclobenzaprine (FLEXERIL) 10 MG tablet Take 10 mg by mouth every 8 (eight) hours as needed for muscle spasms.  04/09/19   [provider]  Dupilumab (DUPIXENT) 300 MG/2ML SOPN Inject 300 mg into the skin every 14 (fourteen) days. 01/03/22   Icard, Leory Plowman L, DO  EPINEPHrine 0.3 mg/0.3 mL IJ SOAJ injection Inject 0.3 mg into the muscle as needed for anaphylaxis.     [provider]  famotidine (PEPCID) 20 MG tablet One after supper 02/12/22   Tanda Rockers, MD   fluticasone [facility]) 50 MCG/ACT nasal spray Place 2 sprays into both nostrils.     [provider]  ibuprofen (ADVIL) 800 MG tablet Take 800 mg by mouth every 8 (eight) hours as needed for mild pain. 07/02/19   [provider]  ipratropium (ATROVENT) 0.03 % nasal spray Place 2 sprays into both nostrils 2 (two) times daily. 03/20/21   Jaynee Eagles, PA-C  levothyroxine (SYNTHROID) 75 MCG tablet Take 75 mcg by mouth every morning. 11/05/21   [provider]  Multiple Vitamin (MULTIVITAMIN) capsule Take 1 capsule by mouth daily.    [provider]  NUCYNTA 50 MG tablet Take 50 mg by mouth 2 (two) times daily as needed for moderate pain.  11/25/19   [provider]  pantoprazole (PROTONIX) 40 MG tablet Take 1 tablet (40 mg total) by mouth daily. Take 30-60 min before first meal of the day 02/12/22   Tanda Rockers, MD  propranolol (INDERAL) 10 MG tablet Take 1 tablet (10 mg total) by mouth daily as needed (Chest Palpitations). 04/13/21   Lorretta Harp, MD  VITAMIN D PO Take by mouth.    [provider]      Allergies    Iodinated casein, Ivp dye [iodinated contrast media], Rocephin [ceftriaxone], Shellfish allergy, Erythromycin, and Diclofenac    Review of Systems   Review of Systems  Respiratory:  Positive for  shortness of breath.     Physical Exam Updated Vital Signs BP 131/80   Pulse 87   Temp 98.2 F (36.8 C) (Temporal)   Resp 11   LMP 07/04/2019 (Approximate) Comment: husband has vascectomy  SpO2 100%  Physical Exam Vitals and nursing note reviewed.  Constitutional:      General: She is not in acute distress.    Appearance: She is well-developed.  HENT:     Head: Normocephalic and atraumatic.     Right Ear: External ear normal.     Left Ear: External ear normal.  Eyes:     General: No scleral icterus.       Right eye: No discharge.        Left eye: No discharge.     Conjunctiva/sclera: Conjunctivae normal.  Neck:      Trachea: No tracheal deviation.  Cardiovascular:     Rate and Rhythm: Normal rate and regular rhythm.  Pulmonary:     Effort: Pulmonary effort is normal. No respiratory distress.     Breath sounds: Normal breath sounds. No stridor. No wheezing or rales.  Abdominal:     General: Bowel sounds are normal. There is no distension.     Palpations: Abdomen is soft.     Tenderness: There is no abdominal tenderness. There is no guarding or rebound.  Musculoskeletal:        General: No deformity.     Cervical back: Neck supple.     Left upper leg: Tenderness present. No deformity.     Left lower leg: No tenderness or bony tenderness. Edema present.     Comments: Extremities are warm and well-perfused  Skin:    General: Skin is warm and dry.     Findings: No rash.  Neurological:     General: No focal deficit present.     Mental Status: She is alert.     Cranial Nerves: No cranial nerve deficit, dysarthria or facial asymmetry.     Sensory: No sensory deficit.     Motor: No abnormal muscle tone or seizure activity.     Coordination: Coordination normal.  Psychiatric:        Mood and Affect: Mood normal.     ED Results / Procedures / Treatments   Labs (all labs ordered are listed, but only abnormal results are displayed) Labs Reviewed  CBC  BASIC METABOLIC PANEL  TROPONIN I (HIGH SENSITIVITY)    EKG EKG Interpretation  Date/Time:  Sunday April 28 2022 14:11:48 EST Ventricular Rate:  100 PR Interval:  136 QRS Duration: 78 QT Interval:  332 QTC Calculation: 428 R Axis:   89 Text Interpretation: Normal sinus rhythm Nonspecific ST abnormality Abnormal ECG No previous ECGs available Confirmed by Dorie Rank [id number]) on 04/28/2022 3:28:15 PM  Radiology US Venous Img Lower  Left (DVT Study)  Result Date: 04/28/2022 CLINICAL DATA:  Thigh pain and swelling for 3 days EXAM: LEFT LOWER EXTREMITY VENOUS DOPPLER ULTRASOUND TECHNIQUE: Gray-scale sonography with graded compression, as well  as color Doppler and duplex ultrasound were performed to evaluate the lower extremity deep venous systems from the level of the common femoral vein and including the common femoral, femoral, profunda femoral, popliteal and calf veins including the posterior tibial, peroneal and gastrocnemius veins when visible. The superficial great saphenous vein was also interrogated. Spectral Doppler was utilized to evaluate flow at rest and with distal augmentation maneuvers in the common femoral, femoral and popliteal veins. COMPARISON:  None Available. FINDINGS: Contralateral Common Femoral  Vein: Respiratory phasicity is normal and symmetric with the symptomatic side. No evidence of thrombus. Normal compressibility. Common Femoral Vein: No evidence of thrombus. Normal compressibility, respiratory phasicity and response to augmentation. Saphenofemoral Junction: No evidence of thrombus. Normal compressibility and flow on color Doppler imaging. Profunda Femoral Vein: No evidence of thrombus. Normal compressibility and flow on color Doppler imaging. Femoral Vein: Occlusive thrombus extends from the proximal superficial femoral vein through the popliteal vein. Popliteal Vein: Occlusive thrombus extends through the popliteal vein. Calf Veins: Not well assessed. Superficial Great Saphenous Vein: No evidence of thrombus. Normal compressibility. Venous Reflux:  None. Other Findings:  None. IMPRESSION: DVT extends the entire length of the left femoral and popliteal veins. The calf veins were not well assessed. Electronically Signed   By: Dorise Bullion III M.D.   On: 04/28/2022 17:12   DG Chest 2 View  Result Date: 04/28/2022 CLINICAL DATA:  Shortness of breath. EXAM: CHEST - 2 VIEW COMPARISON:  None Available. FINDINGS: The heart size and mediastinal contours are within normal limits. Both lungs are clear. The visualized skeletal structures are unremarkable. IMPRESSION: No active cardiopulmonary disease. Electronically Signed    By: Marijo Conception M.D.   On: 04/28/2022 14:35    Procedures Procedures    Medications Ordered in ED Medications  albuterol (VENTOLIN HFA) 108 (90 Base) MCG/ACT inhaler 2 puff (has no administration in time range)    ED Course/ Medical Decision Making/ A&P Clinical Course as of 04/28/22 1729  Nancy Fetter Apr 28, 2022  1716 US Venous Img Lower  Left (DVT Study) DVT involves left femoral and popliteal veins. [JK]  1724 Case discussed with Dr. Regenia Skeeter.  Will plan on transfer to [facility] to obtain a VQ scan.  Patient unfortunately has a contrast allergy and also states she had a reaction even with contrast allergy protocol with prior CT scans.  She will require VQ scan [JK]    Clinical Course User Index [JK] Dorie Rank, MD                             Medical Decision Making Amount and/or Complexity of Data Reviewed Labs: ordered. Decision-making details documented in ED Course. Radiology: ordered. Decision-making details documented in ED Course.  Risk Prescription drug management.   Patient presented to the ED for evaluation of left leg swelling and pain.  Symptoms were concerning for the possibility of DVT.  Patient's exam was not suggestive of cellulitis.  She had not had any recent trauma.  Patient's ultrasound does confirm a DVT involving the femoral and popliteal vein.  Patient also mentioned having some shortness of breath though fortunately she is not tachycardic or tachypneic.  Patient needs further imaging to evaluate for possible pulmonary embolism.  Unfortunately she has a contrast allergy.  Patient states she also had a reaction when she was given pretreatment.  Patient states she has had V/Q tests in the past.  VQ scan is not available at this facility.  I will start her on heparin for treatment of her DVT pending V/Q study.  I spoken with Dr. Verner Chol and we will arrange transfer to [facility] so she can have that advanced imaging test.  If her VQ scan is  negative then I believe she can be safely discharged on a DOAC with outpatient vascular follow-up considering the size of her DVT.        Final Clinical Impression(s) / ED Diagnoses Final  diagnoses:  Acute deep vein thrombosis (DVT) of femoral vein of left lower extremity [facility])    Rx / DC Orders ED Discharge Orders     None         Dorie Rank, MD 04/28/22 1729

## 2022-04-28 NOTE — Progress Notes (Signed)
ANTICOAGULATION CONSULT NOTE - Initial Consult  Pharmacy Consult for heparin Indication: DVT  Allergies  Allergen Reactions   Iodinated Casein Anaphylaxis   Ivp Dye [Iodinated Contrast Media] Anaphylaxis   Rocephin [Ceftriaxone] Hives and Nausea And Vomiting   Shellfish Allergy Anaphylaxis   Erythromycin Diarrhea, Nausea And Vomiting and Hives   Diclofenac Hives    Patient Measurements:   Heparin Dosing Weight: 82kg  Vital Signs: Temp: 98.2 F (36.8 C) (01/28 1700) Temp Source: Temporal (01/28 1700) BP: 131/80 (01/28 1700) Pulse Rate: 87 (01/28 1700)  Labs: Recent Labs    04/28/22 1554  HGB 13.0  HCT 38.8  PLT 216  CREATININE 0.75    CrCl cannot be calculated (Unknown ideal weight.).   Medical History: Past Medical History:  Diagnosis Date   Allergy    Anemia    past hx    Anxiety    Asthma    Constipation    no meds-    COVID-19 virus infection    05-25-2019   GERD (gastroesophageal reflux disease)    past hx - no meds now    Neuromuscular disorder (HCC)    fibromyalgia    Palpitations    due to Thyroid    Thyroid disease     Assessment: 47 YOF presenting with leg pain and SOB, doppler + for DVT, PE study pending, she is not on anticoagulation PTA  Goal of Therapy:  Heparin level 0.3-0.7 units/ml Monitor platelets by anticoagulation protocol: Yes   Plan:  Heparin 5500 units IV x 1, and gtt at 1400 units/hr F/u 6 hour heparin level  Bertis Ruddy, PharmD, [place] Pharmacist ED Pharmacist Phone # [phone] 04/28/2022 5:42 PM

## 2022-04-28 NOTE — H&P (Addendum)
History and Physical  Jean Huber:[id number] DOB: Nov 09, 1970 DOA: 04/28/2022  Referring physician: Dr. Criss Alvine, EDP  PCP: Cleatis Polka., MD  Outpatient Specialists: None Patient coming from: Home  Chief Complaint: Exertional shortness of breath.  HPI: Jean Huber is a 52 y.o. female with medical history significant for obesity, hypothyroidism, asthma, GERD, COVID-19 viral infection x 3, last covid-19 viral infection was 3 weeks ago, who initially presented to [facility] ED with complaints of left leg swelling and pain since Friday, 3 days ago.  Denies any trauma.    3 weeks ago, when she was diagnosed with covid-19, she felt very weak and was not moving as much.  Completed her course of Molnupiravir.  Recently went on a trip that lasted about 2 hours.  Not on hormonal therapy or birth control.  Does not use tobacco.  Workup in the ED revealed extensive DVT involving her left lower extremity.  Endorses exertional dyspnea for the past day or so.  She has a history of anaphylaxis to IV contrast despite pretreatment.  Due to concern for acute pulmonary embolism, EDP requested transfer to [facility] for VQ scan study.  The patient was started on heparin drip and IV opiate-based analgesics.  Upon arrival to the ED, VQ scan is not available until the morning.  Not hypoxic, O2 saturation 100% on room air, at rest.  The patient was admitted by [facility], hospitalist service.  ED Course: Tmax 99.3.  BP 147/80, pulse 100, respiratory 16, O2 saturation 100% on room air.  Lab studies, CBC and BMP essentially unremarkable.  Negative high-sensitivity troponin x 1.  Review of Systems: Review of systems as noted in the HPI. All other systems reviewed and are negative.   Past Medical History:  Diagnosis Date   Allergy    Anemia    past hx    Anxiety    Asthma    Constipation    no meds-    COVID-19 virus infection    05-25-2019   GERD (gastroesophageal reflux disease)     past hx - no meds now    Neuromuscular disorder (HCC)    fibromyalgia    Palpitations    due to Thyroid    Thyroid disease    Past Surgical History:  Procedure Laterality Date   APPENDECTOMY     CARPAL TUNNEL RELEASE Left    CESAREAN SECTION     x 1    COLONOSCOPY     age 35 in [place] due to diarrhea/ constipation    ROTATOR CUFF REPAIR Right    SPINAL FUSION     L4-L5   TONSILLECTOMY      Social History:  reports that she has never smoked. She has never used smokeless tobacco. She reports current alcohol use. She reports that she does not use drugs.   Allergies  Allergen Reactions   Iodinated Casein Anaphylaxis   Ivp Dye [Iodinated Contrast Media] Anaphylaxis   Rocephin [Ceftriaxone] Hives and Nausea And Vomiting   Shellfish Allergy Anaphylaxis   Erythromycin Diarrhea, Nausea And Vomiting and Hives   Diclofenac Hives    Family History  Problem Relation Age of Onset   Breast cancer Maternal Aunt    Colon cancer Paternal Aunt    Kidney cancer Paternal Grandmother    Bladder Cancer Paternal Grandmother    Pancreatic cancer Paternal Grandfather    Colon cancer Cousin    Colon polyps Neg Hx    Esophageal cancer Neg Hx  Rectal cancer Neg Hx    Stomach cancer Neg Hx       Prior to Admission medications   Medication Sig Start Date End Date Taking? Authorizing Provider  albuterol (PROVENTIL) (2.5 MG/3ML) 0.083% nebulizer solution Take 2.5 mg by nebulization every 4 (four) hours as needed for wheezing or shortness of breath.  05/13/16   [provider]  cetirizine (ZYRTEC ALLERGY) 10 MG tablet Take 1 tablet (10 mg total) by mouth daily. 03/20/21   Wallis Bamberg, PA-C  chlorpheniramine-HYDROcodone (TUSSIONEX) 10-8 MG/5ML Take 5 mLs by mouth at bedtime as needed for cough. 02/12/22   Nyoka Cowden, MD  cyclobenzaprine (FLEXERIL) 10 MG tablet Take 10 mg by mouth every 8 (eight) hours as needed for muscle spasms.  04/09/19   [provider]  Dupilumab  (DUPIXENT) 300 MG/2ML SOPN Inject 300 mg into the skin every 14 (fourteen) days. 01/03/22   Icard, Elige Radon L, DO  EPINEPHrine 0.3 mg/0.3 mL IJ SOAJ injection Inject 0.3 mg into the muscle as needed for anaphylaxis.     [provider]  famotidine (PEPCID) 20 MG tablet One after supper 02/12/22   Nyoka Cowden, MD  fluticasone [facility]) 50 MCG/ACT nasal spray Place 2 sprays into both nostrils.     [provider]  ibuprofen (ADVIL) 800 MG tablet Take 800 mg by mouth every 8 (eight) hours as needed for mild pain. 07/02/19   [provider]  ipratropium (ATROVENT) 0.03 % nasal spray Place 2 sprays into both nostrils 2 (two) times daily. 03/20/21   Wallis Bamberg, PA-C  levothyroxine (SYNTHROID) 75 MCG tablet Take 75 mcg by mouth every morning. 11/05/21   [provider]  Multiple Vitamin (MULTIVITAMIN) capsule Take 1 capsule by mouth daily.    [provider]  NUCYNTA 50 MG tablet Take 50 mg by mouth 2 (two) times daily as needed for moderate pain.  11/25/19   [provider]  pantoprazole (PROTONIX) 40 MG tablet Take 1 tablet (40 mg total) by mouth daily. Take 30-60 min before first meal of the day 02/12/22   Nyoka Cowden, MD  propranolol (INDERAL) 10 MG tablet Take 1 tablet (10 mg total) by mouth daily as needed (Chest Palpitations). 04/13/21   Runell Gess, MD  VITAMIN D PO Take by mouth.    [provider]    Physical Exam: BP (!) 147/80 (BP Location: Left Arm)   Pulse 100   Temp 99.3 F (37.4 C) (Oral)   Resp 16   LMP 07/04/2019 (Approximate) Comment: husband has vascectomy  SpO2 100%   General: 52 y.o. year-old female well developed well nourished in no acute distress.  Alert and oriented x3. Cardiovascular: Regular rate and rhythm with no rubs or gallops.  No thyromegaly or JVD noted.  Left lower extremity edema. 2/4 pulses in all 4 extremities. Respiratory: Clear to auscultation with no wheezes or rales. Good inspiratory  effort. Abdomen: Soft nontender nondistended with normal bowel sounds x4 quadrants. Muskuloskeletal: No cyanosis or clubbing noted bilaterally Neuro: CN II-XII intact, strength, sensation, reflexes Skin: No ulcerative lesions noted or rashes Psychiatry: Judgement and insight appear normal. Mood is appropriate for condition and setting          Labs on Admission:  Basic Metabolic Panel: Recent Labs  Lab 04/28/22 1554  NA 139  K 3.9  CL 104  CO2 26  GLUCOSE 97  BUN 16  CREATININE 0.75  CALCIUM 9.6   Liver Function Tests: No results for  input(s): "AST", "ALT", "ALKPHOS", "BILITOT", "PROT", "ALBUMIN" in the last 168 hours. No results for input(s): "LIPASE", "AMYLASE" in the last 168 hours. No results for input(s): "AMMONIA" in the last 168 hours. CBC: Recent Labs  Lab 04/28/22 1554  WBC 9.8  HGB 13.0  HCT 38.8  MCV 85.1  PLT 216   Cardiac Enzymes: No results for input(s): "CKTOTAL", "CKMB", "CKMBINDEX", "TROPONINI" in the last 168 hours.  BNP (last 3 results) No results for input(s): "BNP" in the last 8760 hours.  ProBNP (last 3 results) No results for input(s): "PROBNP" in the last 8760 hours.  CBG: No results for input(s): "GLUCAP" in the last 168 hours.  Radiological Exams on Admission: US Venous Img Lower  Left (DVT Study)  Result Date: 04/28/2022 CLINICAL DATA:  Thigh pain and swelling for 3 days EXAM: LEFT LOWER EXTREMITY VENOUS DOPPLER ULTRASOUND TECHNIQUE: Gray-scale sonography with graded compression, as well as color Doppler and duplex ultrasound were performed to evaluate the lower extremity deep venous systems from the level of the common femoral vein and including the common femoral, femoral, profunda femoral, popliteal and calf veins including the posterior tibial, peroneal and gastrocnemius veins when visible. The superficial great saphenous vein was also interrogated. Spectral Doppler was utilized to evaluate flow at rest and with distal augmentation  maneuvers in the common femoral, femoral and popliteal veins. COMPARISON:  None Available. FINDINGS: Contralateral Common Femoral Vein: Respiratory phasicity is normal and symmetric with the symptomatic side. No evidence of thrombus. Normal compressibility. Common Femoral Vein: No evidence of thrombus. Normal compressibility, respiratory phasicity and response to augmentation. Saphenofemoral Junction: No evidence of thrombus. Normal compressibility and flow on color Doppler imaging. Profunda Femoral Vein: No evidence of thrombus. Normal compressibility and flow on color Doppler imaging. Femoral Vein: Occlusive thrombus extends from the proximal superficial femoral vein through the popliteal vein. Popliteal Vein: Occlusive thrombus extends through the popliteal vein. Calf Veins: Not well assessed. Superficial Great Saphenous Vein: No evidence of thrombus. Normal compressibility. Venous Reflux:  None. Other Findings:  None. IMPRESSION: DVT extends the entire length of the left femoral and popliteal veins. The calf veins were not well assessed. Electronically Signed   By: Gerome Sam III M.D.   On: 04/28/2022 17:12   DG Chest 2 View  Result Date: 04/28/2022 CLINICAL DATA:  Shortness of breath. EXAM: CHEST - 2 VIEW COMPARISON:  None Available. FINDINGS: The heart size and mediastinal contours are within normal limits. Both lungs are clear. The visualized skeletal structures are unremarkable. IMPRESSION: No active cardiopulmonary disease. Electronically Signed   By: Lupita Raider M.D.   On: 04/28/2022 14:35    EKG: I independently viewed the EKG done and my findings are as followed: Normal sinus rhythm rate of 100.  Nonspecific ST-T changes.  QTc 428.  Assessment/Plan Present on Admission:  DVT (deep venous thrombosis) (HCC)  Principal Problem:   DVT (deep venous thrombosis) (HCC)  Newly diagnosed left lower extremity extensive DVT, POA In the setting of recent covid-19 viral infection, recent  somewhat lengthy trip, obesity. Venous Doppler ultrasound positive for extensive DVT involving left lower extremity on 04/28/2022. Started on heparin drip, continue Consider vascular surgery evaluation in the morning. Consider switching to DOAC Eliquis prior to discharge  Exertional dyspnea, rule out acute pulmonary embolism Anaphylactic reaction to IV contrast despite pretreatment Follow VQ scan results Not hypoxic on ambient air.  Obesity Last weight 101 kg Recommend weight loss outpatient with regular physical activity and healthy dieting.  Hypothyroidism Resume  home levothyroxine  GERD Resume home PPI and home H2 blocker  Asthma Well controlled Resume home regimen   DVT prophylaxis: Heparin drip  Code Status: Full code  Family Communication: Updated her husband at bedside  Disposition Plan: Admitted to telemetry medical unit  Consults called: None.  Admission status: Observation status   Status is: Observation    Darlin Drop MD [facility] Pager [phone]  If 7PM-7AM, please contact night-coverage [email or web address] Password [email or web address]  04/28/2022, 8:10 PM

## 2022-04-28 NOTE — ED Provider Notes (Signed)
Transferred from [place].  Unfortunately we do not have VQ scan ability tonight.  She will need to be admitted on heparin given her shortness of breath in the setting of a DVT and get a V/Q in the morning.  Discussed with Dr. [place] Crane for admission.   Sherwood Gambler, MD 04/28/22 2252

## 2022-04-29 ENCOUNTER — Observation Stay (HOSPITAL_COMMUNITY): Payer: Commercial Managed Care - PPO

## 2022-04-29 DIAGNOSIS — I82462 Acute embolism and thrombosis of left calf muscular vein: Secondary | ICD-10-CM | POA: Diagnosis not present

## 2022-04-29 DIAGNOSIS — E039 Hypothyroidism, unspecified: Secondary | ICD-10-CM | POA: Diagnosis present

## 2022-04-29 DIAGNOSIS — Z91013 Allergy to seafood: Secondary | ICD-10-CM | POA: Diagnosis not present

## 2022-04-29 DIAGNOSIS — Z6836 Body mass index (BMI) 36.0-36.9, adult: Secondary | ICD-10-CM | POA: Diagnosis not present

## 2022-04-29 DIAGNOSIS — Z79899 Other long term (current) drug therapy: Secondary | ICD-10-CM | POA: Diagnosis not present

## 2022-04-29 DIAGNOSIS — Z91041 Radiographic dye allergy status: Secondary | ICD-10-CM | POA: Diagnosis not present

## 2022-04-29 DIAGNOSIS — Z981 Arthrodesis status: Secondary | ICD-10-CM | POA: Diagnosis not present

## 2022-04-29 DIAGNOSIS — Z87892 Personal history of anaphylaxis: Secondary | ICD-10-CM | POA: Diagnosis not present

## 2022-04-29 DIAGNOSIS — Z881 Allergy status to other antibiotic agents status: Secondary | ICD-10-CM | POA: Diagnosis not present

## 2022-04-29 DIAGNOSIS — Z888 Allergy status to other drugs, medicaments and biological substances status: Secondary | ICD-10-CM | POA: Diagnosis not present

## 2022-04-29 DIAGNOSIS — I2699 Other pulmonary embolism without acute cor pulmonale: Secondary | ICD-10-CM | POA: Diagnosis present

## 2022-04-29 DIAGNOSIS — Z79890 Hormone replacement therapy (postmenopausal): Secondary | ICD-10-CM | POA: Diagnosis not present

## 2022-04-29 DIAGNOSIS — Z8616 Personal history of COVID-19: Secondary | ICD-10-CM | POA: Diagnosis not present

## 2022-04-29 DIAGNOSIS — O2230 Deep phlebothrombosis in pregnancy, unspecified trimester: Secondary | ICD-10-CM | POA: Diagnosis present

## 2022-04-29 DIAGNOSIS — I82412 Acute embolism and thrombosis of left femoral vein: Secondary | ICD-10-CM | POA: Diagnosis present

## 2022-04-29 DIAGNOSIS — E669 Obesity, unspecified: Secondary | ICD-10-CM | POA: Diagnosis present

## 2022-04-29 DIAGNOSIS — J45909 Unspecified asthma, uncomplicated: Secondary | ICD-10-CM | POA: Diagnosis present

## 2022-04-29 DIAGNOSIS — I82502 Chronic embolism and thrombosis of unspecified deep veins of left lower extremity: Secondary | ICD-10-CM | POA: Diagnosis not present

## 2022-04-29 DIAGNOSIS — T886XXA Anaphylactic reaction due to adverse effect of correct drug or medicament properly administered, initial encounter: Secondary | ICD-10-CM | POA: Diagnosis present

## 2022-04-29 DIAGNOSIS — K219 Gastro-esophageal reflux disease without esophagitis: Secondary | ICD-10-CM | POA: Diagnosis present

## 2022-04-29 DIAGNOSIS — I82409 Acute embolism and thrombosis of unspecified deep veins of unspecified lower extremity: Secondary | ICD-10-CM | POA: Diagnosis present

## 2022-04-29 LAB — HIV ANTIBODY (ROUTINE TESTING W REFLEX): HIV Screen 4th Generation wRfx: NONREACTIVE

## 2022-04-29 LAB — CBC
HCT: 36.4 % (ref 36.0–46.0)
Hemoglobin: 12.6 g/dL (ref 12.0–15.0)
MCH: 29.4 pg (ref 26.0–34.0)
MCHC: 34.6 g/dL (ref 30.0–36.0)
MCV: 84.8 fL (ref 80.0–100.0)
Platelets: 219 10*3/uL (ref 150–400)
RBC: 4.29 MIL/uL (ref 3.87–5.11)
RDW: 14.5 % (ref 11.5–15.5)
WBC: 10 10*3/uL (ref 4.0–10.5)
nRBC: 0 % (ref 0.0–0.2)

## 2022-04-29 LAB — BASIC METABOLIC PANEL
Anion gap: 10 (ref 5–15)
BUN: 11 mg/dL (ref 6–20)
CO2: 22 mmol/L (ref 22–32)
Calcium: 8.9 mg/dL (ref 8.9–10.3)
Chloride: 105 mmol/L (ref 98–111)
Creatinine, Ser: 0.7 mg/dL (ref 0.44–1.00)
GFR, Estimated: >60 mL/min (ref >60)
Glucose, Bld: 99 mg/dL (ref 70–99)
Potassium: 3.5 mmol/L (ref 3.5–5.1)
Sodium: 137 mmol/L (ref 135–145)

## 2022-04-29 LAB — HEPARIN LEVEL (UNFRACTIONATED)
Heparin Unfractionated: >1.1 IU/mL — ABNORMAL HIGH (ref 0.30–0.70)
Heparin Unfractionated: 0.52 IU/mL (ref 0.30–0.70)

## 2022-04-29 MED ORDER — TECHNETIUM TO 99M ALBUMIN AGGREGATED
4.1000 | Freq: Once | INTRAVENOUS | Status: AC | PRN
  Administered 2022-04-29: 4.1 via INTRAVENOUS

## 2022-04-29 MED ORDER — HEPARIN (PORCINE) 25000 UT/250ML-% IV SOLN
1000.0000 [IU]/h | INTRAVENOUS | Status: AC
  Administered 2022-04-29: 1000 [IU]/h via INTRAVENOUS

## 2022-04-29 NOTE — Progress Notes (Signed)
ANTICOAGULATION CONSULT NOTE  Pharmacy Consult for heparin Indication: DVT  Allergies  Allergen Reactions   Iodinated Casein Anaphylaxis   Ivp Dye [Iodinated Contrast Media] Anaphylaxis   Rocephin [Ceftriaxone] Hives and Nausea And Vomiting   Shellfish Allergy Anaphylaxis   Erythromycin Diarrhea, Nausea And Vomiting and Hives   Diclofenac Hives    Patient Measurements: Height: '5\' 5"'$  (165.1 cm) Weight: 100.2 kg (220 lb 14.4 oz) IBW/kg (Calculated) : 57 Heparin Dosing Weight: 82kg  Vital Signs: Temp: 98.1 F (36.7 C) (01/29 2014) Temp Source: Oral (01/29 2014) BP: 135/79 (01/29 2014) Pulse Rate: 88 (01/29 2014)  Labs: Recent Labs    04/28/22 1554 04/28/22 1730 04/28/22 2225 04/28/22 2324 04/29/22 0315 04/29/22 0920 04/29/22 1938  HGB 13.0  --   --   --  12.6  --   --   HCT 38.8  --   --   --  36.4  --   --   PLT 216  --   --   --  219  --   --   HEPARINUNFRC  --   --    < > >1.10*  --  >1.10* 0.52  CREATININE 0.75  --   --   --  0.70  --   --   TROPONINIHS  --  <2  --   --   --   --   --    < > = values in this interval not displayed.     Estimated Creatinine Clearance: 95.4 mL/min (by C-G formula based on SCr of 0.7 mg/dL).  Assessment: 56 YOF presenting with leg pain and SOB, doppler + for DVT. She is not on anticoagulation PTA. Pharmacy consulted to dose heparin.  Heparin level now therapeutic (0.52) on infusion at 1000 units/hr. No bleeding noted.  Goal of Therapy:  Heparin level 0.3-0.7 units/ml Monitor platelets by anticoagulation protocol: Yes   Plan:  Continue heparin 1000 units/hr  F/u a.m. level to confirm therapeutic  Sherlon Handing, PharmD, BCPS Please see [email or web address] for complete clinical pharmacist phone list 04/29/2022 8:52 PM

## 2022-04-29 NOTE — Progress Notes (Signed)
ANTICOAGULATION CONSULT NOTE  Pharmacy Consult for heparin Indication: DVT  Allergies  Allergen Reactions   Iodinated Casein Anaphylaxis   Ivp Dye [Iodinated Contrast Media] Anaphylaxis   Rocephin [Ceftriaxone] Hives and Nausea And Vomiting   Shellfish Allergy Anaphylaxis   Erythromycin Diarrhea, Nausea And Vomiting and Hives   Diclofenac Hives    Patient Measurements: Height: '5\' 4"'$  (162.6 cm) Weight: 101.2 kg (223 lb 1.7 oz) IBW/kg (Calculated) : 54.7 Heparin Dosing Weight: 82kg  Vital Signs: Temp: 98 F (36.7 C) (01/29 0910) Temp Source: Oral (01/29 0910) BP: 152/86 (01/29 0910) Pulse Rate: 85 (01/29 0910)  Labs: Recent Labs    01/Jean/24 1554 01/Jean/24 1730 01/Jean/24 2225 01/Jean/24 2324 04/29/22 0315 04/29/22 0920  HGB 13.0  --   --   --  12.6  --   HCT 38.8  --   --   --  36.4  --   PLT 216  --   --   --  219  --   HEPARINUNFRC  --   --  >1.10* >1.10*  --  >1.10*  CREATININE 0.75  --   --   --  0.70  --   TROPONINIHS  --  <2  --   --   --   --      Estimated Creatinine Clearance: 94.1 mL/min (by C-G formula based on SCr of 0.7 mg/dL).   Medical History: Past Medical History:  Diagnosis Date   Allergy    Anemia    past hx    Anxiety    Asthma    Constipation    no meds-    COVID-19 virus infection    05-25-2019   GERD (gastroesophageal reflux disease)    past hx - no meds now    Neuromuscular disorder (HCC)    fibromyalgia    Palpitations    due to Thyroid    Thyroid disease     Assessment: Jean Huber presenting with leg pain and SOB, doppler + for DVT. She is not on anticoagulation PTA. Pharmacy consulted to dose heparin.  Heparin level >1.1 on heparin 1200 units/hr. Verified with RN sample was drawn from alternative line from where heparin was running. CBC okay - no signs of bleeding.  Goal of Therapy:  Heparin level 0.3-0.7 units/ml Monitor platelets by anticoagulation protocol: Yes   Plan:  Hold heparin x 1 hr Re-start heparin at 1000  units/hr  Check 6hr heparin level at [place], PharmD Clinical Pharmacist

## 2022-04-29 NOTE — Progress Notes (Signed)
PROGRESS NOTE    Jean Huber  ZOX:[id number] DOB: [birth year] DOA: 04/28/2022  PCP: Cleatis Polka., MD   Brief Narrative:  This 52 years old female with PMH significant for obesity, hypothyroidism, asthma, GERD, COVID-19 infection x 3, last COVID-19 infection was 3 weeks ago who initially presented in the ED with c/o: left leg swelling and pain for 3 days.  She denies any fever, trauma, injury to the leg. She has completed the course of molnupiravir.  She has recently went to a trip that lasted about 2 hours, not on hormone replacement therapy or birth control pills.  Workup in the ED reveals extensive DVT involving her left lower extremity.  She also endorses exertional dyspnea for past day or 2.  She does have history of anaphylaxis to IV contrast despite pretreatment.  Due to the concerns for acute pulmonary embolism , Patient was admitted at [facility] and V/Q Scan was completed which was positive for pulmonary embolism. Vascular surgery is consulted.  Patient is continued on IV heparin.  Assessment & Plan:   Principal Problem:   DVT (deep venous thrombosis) (HCC) Active Problems:   DVT (deep vein thrombosis) in pregnancy  Left lower extremity DVT, Extensive: In the setting of recent covid-19 viral infection, recent somewhat lengthy trip, obesity. Venous Doppler ultrasound positive for extensive DVT involving left lower extremity on 04/28/2022. Initiated on IV heparin, will subsequently transition to Eliquis before discharge. Vascular surgery is consulted.   Acute pulmonary embolism: Patient continues to have exertional dyspnea. She reports Anaphylactic reaction to IV contrast despite pretreatment. VQ scan high probability for PE. Patient remains on room air, not hypoxic. Continue IV heparin for now.   Obesity: Last weight 101 kg Recommend weight loss outpatient with regular physical activity and healthy dieting.   Hypothyroidism: Continue levothyroxine.    GERD: Continue pantoprazole and famotidine   Asthma: Well controlled. Resume home regimen.   DVT prophylaxis:Heparin Code Status:Full code Family Communication: No family at bed side. Disposition Plan:   Status is: Inpatient Remains inpatient appropriate because: Admitted for extensive DVT and PE started on IV heparin. Vascular surgery is consulted   Consultants:  Vascular surgery  Procedures: VQ scan  Antimicrobials: None.  Subjective: Patient was seen and examined at bedside.  Overnight events noted. Patient reports having pain in the left leg but denies any shortness of breath.  Objective: Vitals:   04/29/22 0910 04/29/22 1210 04/29/22 1310 04/29/22 1400  BP: (!) 152/86 131/84  134/81  Pulse: 85 81  83  Resp: 14 12  20   Temp: 98 F (36.7 C)  98.1 F (36.7 C)   TempSrc: Oral     SpO2: 99% 97%  99%  Weight:      Height:       No intake or output data in the 24 hours ending 04/29/22 1450 Filed Weights   04/28/22 2301  Weight: 101.2 kg    Examination:  General exam: Appears comfortable, not in any acute distress.  Deconditioned Respiratory system: Clear to auscultation. Respiratory effort normal. [id number] Cardiovascular system: S1 & S2 heard, regular rate and rhythm, no murmur. Gastrointestinal system: Abdomen is soft, non tender, non distended, BS + Central nervous system: Alert and oriented. No focal neurological deficits. Extremities: Left leg tenderness+, No edema, no cyanosis, no clubbing Skin: No rashes, lesions or ulcers Psychiatry: Judgement and insight appear normal. Mood & affect appropriate.     Data Reviewed: I have personally reviewed following labs and imaging studies  CBC:  Recent Labs  Lab 04/28/22 1554 04/29/22 0315  WBC 9.8 10.0  HGB 13.0 12.6  HCT 38.8 36.4  MCV 85.1 84.8  PLT 216 219   Basic Metabolic Panel: Recent Labs  Lab 04/28/22 1554 04/29/22 0315  NA 139 137  K 3.9 3.5  CL 104 105  CO2 26 22  GLUCOSE 97 99  BUN  16 11  CREATININE 0.75 0.70  CALCIUM 9.6 8.9   GFR: Estimated Creatinine Clearance: 94.1 mL/min (by C-G formula based on SCr of 0.7 mg/dL). Liver Function Tests: No results for input(s): "AST", "ALT", "ALKPHOS", "BILITOT", "PROT", "ALBUMIN" in the last 168 hours. No results for input(s): "LIPASE", "AMYLASE" in the last 168 hours. No results for input(s): "AMMONIA" in the last 168 hours. Coagulation Profile: No results for input(s): "INR", "PROTIME" in the last 168 hours. Cardiac Enzymes: No results for input(s): "CKTOTAL", "CKMB", "CKMBINDEX", "TROPONINI" in the last 168 hours. BNP (last 3 results) No results for input(s): "PROBNP" in the last 8760 hours. HbA1C: No results for input(s): "HGBA1C" in the last 72 hours. CBG: No results for input(s): "GLUCAP" in the last 168 hours. Lipid Profile: No results for input(s): "CHOL", "HDL", "LDLCALC", "TRIG", "CHOLHDL", "LDLDIRECT" in the last 72 hours. Thyroid Function Tests: No results for input(s): "TSH", "T4TOTAL", "FREET4", "T3FREE", "THYROIDAB" in the last 72 hours. Anemia Panel: No results for input(s): "VITAMINB12", "FOLATE", "FERRITIN", "TIBC", "IRON", "RETICCTPCT" in the last 72 hours. Sepsis Labs: No results for input(s): "PROCALCITON", "LATICACIDVEN" in the last 168 hours.  No results found for this or any previous visit (from the past 240 hour(s)).   Radiology Studies: NM Pulmonary Perfusion  Addendum Date: 04/29/2022   ADDENDUM REPORT: 04/29/2022 12:12 ADDENDUM: Critical Value/emergent results were called by telephone at the time of interpretation on 04/29/2022 at 12:11 pm to provider Dr. Lucianne Muss, who verbally acknowledged these results. Electronically Signed   By: Signa Kell M.D.   On: 04/29/2022 12:12   Result Date: 04/29/2022 CLINICAL DATA:  Evaluate for pulmonary embolus.  Shortness of breath EXAM: NUCLEAR MEDICINE PERFUSION LUNG SCAN TECHNIQUE: Perfusion images were obtained in multiple projections after intravenous  injection of radiopharmaceutical. Ventilation scans intentionally deferred if perfusion scan and chest x-ray adequate for interpretation during COVID 19 epidemic. RADIOPHARMACEUTICALS:  4.1 mCi Tc-68m MAA IV COMPARISON:  chest radiograph from 04/28/2022. FINDINGS: There is a large segmental perfusion defect identified involving the right upper lobe. There is also a large segmental perfusion defect involving the anterior basal left upper lobe. IMPRESSION: Exam positive for large segmental perfusion defects compatible with acute pulmonary embolus. Electronically Signed: By: Signa Kell M.D. On: 04/29/2022 11:47   US Venous Img Lower  Left (DVT Study)  Result Date: 04/28/2022 CLINICAL DATA:  Thigh pain and swelling for 3 days EXAM: LEFT LOWER EXTREMITY VENOUS DOPPLER ULTRASOUND TECHNIQUE: Gray-scale sonography with graded compression, as well as color Doppler and duplex ultrasound were performed to evaluate the lower extremity deep venous systems from the level of the common femoral vein and including the common femoral, femoral, profunda femoral, popliteal and calf veins including the posterior tibial, peroneal and gastrocnemius veins when visible. The superficial great saphenous vein was also interrogated. Spectral Doppler was utilized to evaluate flow at rest and with distal augmentation maneuvers in the common femoral, femoral and popliteal veins. COMPARISON:  None Available. FINDINGS: Contralateral Common Femoral Vein: Respiratory phasicity is normal and symmetric with the symptomatic side. No evidence of thrombus. Normal compressibility. Common Femoral Vein: No evidence of thrombus. Normal compressibility, respiratory phasicity  and response to augmentation. Saphenofemoral Junction: No evidence of thrombus. Normal compressibility and flow on color Doppler imaging. Profunda Femoral Vein: No evidence of thrombus. Normal compressibility and flow on color Doppler imaging. Femoral Vein: Occlusive thrombus  extends from the proximal superficial femoral vein through the popliteal vein. Popliteal Vein: Occlusive thrombus extends through the popliteal vein. Calf Veins: Not well assessed. Superficial Great Saphenous Vein: No evidence of thrombus. Normal compressibility. Venous Reflux:  None. Other Findings:  None. IMPRESSION: DVT extends the entire length of the left femoral and popliteal veins. The calf veins were not well assessed. Electronically Signed   By: Gerome Sam III M.D.   On: 04/28/2022 17:12   DG Chest 2 View  Result Date: 04/28/2022 CLINICAL DATA:  Shortness of breath. EXAM: CHEST - 2 VIEW COMPARISON:  None Available. FINDINGS: The heart size and mediastinal contours are within normal limits. Both lungs are clear. The visualized skeletal structures are unremarkable. IMPRESSION: No active cardiopulmonary disease. Electronically Signed   By: Lupita Raider M.D.   On: 04/28/2022 14:35    Scheduled Meds:  famotidine  40 mg Oral Daily   ipratropium  2 spray Each Nare BID   levothyroxine  75 mcg Oral q morning   loratadine  10 mg Oral QHS   multivitamin with minerals  1 tablet Oral Daily   pantoprazole  40 mg Oral Daily   senna-docusate  1 tablet Oral QHS   Continuous Infusions:  heparin 1,000 Units/hr (04/29/22 1308)     LOS: 0 days    Time spent: 50 mins    Dianara Smullen, MD [facility]   If 7PM-7AM, please contact night-coverage

## 2022-04-29 NOTE — Plan of Care (Signed)
  Problem: Education: Goal: Knowledge of General Education information will improve Description: Including pain rating scale, medication(s)/side effects and non-pharmacologic comfort measures Outcome: Progressing   Problem: Health Behavior/Discharge Planning: Goal: Ability to manage health-related needs will improve Outcome: Progressing   Problem: Clinical Measurements: Goal: Cardiovascular complication will be avoided Outcome: Progressing   Problem: Clinical Measurements: Goal: Respiratory complications will improve Outcome: Progressing   Problem: Coping: Goal: Level of anxiety will decrease Outcome: Progressing   Problem: Pain Managment: Goal: General experience of comfort will improve Outcome: Progressing   Problem: Safety: Goal: Ability to remain free from injury will improve Outcome: Progressing   Problem: Skin Integrity: Goal: Risk for impaired skin integrity will decrease Outcome: Progressing

## 2022-04-29 NOTE — Consult Note (Addendum)
Hospital Consult    Reason for Consult:  LLE DVT Requesting Physician:  ED MRN #:  [id number]  History of Present Illness: This is a 52 y.o. female with past medical history significant for asthma, obesity, COVID 3 weeks ago.  She presented to the emergency department with shortness of breath and left leg pain and swelling.  Left leg edema and pain began on Friday.  She developed shortness of breath yesterday and presented to the emergency department.  Workup included PE study which was positive for bilateral PE.  She also underwent left lower extremity venous duplex which was positive for occlusive thrombus involving the proximal femoral vein of the left leg.  There was no involvement in the profunda vein, common femoral vein, or iliac veins.  She denies history of DVT.  She denies tobacco use.  Patient states she has always had some edema of the left leg compared to the right at baseline.  It should also be noted she has been started on heparin since admission and believes her left leg feels much better than it did over the weekend.  Past Medical History:  Diagnosis Date   Allergy    Anemia    past hx    Anxiety    Asthma    Constipation    no meds-    COVID-19 virus infection    05-25-2019   GERD (gastroesophageal reflux disease)    past hx - no meds now    Neuromuscular disorder (HCC)    fibromyalgia    Palpitations    due to Thyroid    Thyroid disease     Past Surgical History:  Procedure Laterality Date   APPENDECTOMY     CARPAL TUNNEL RELEASE Left    CESAREAN SECTION     x 1    COLONOSCOPY     age 44 in [place] due to diarrhea/ constipation    ROTATOR CUFF REPAIR Right    SPINAL FUSION     L4-L5   TONSILLECTOMY      Allergies  Allergen Reactions   Iodinated Casein Anaphylaxis   Ivp Dye [Iodinated Contrast Media] Anaphylaxis   Rocephin [Ceftriaxone] Hives and Nausea And Vomiting   Shellfish Allergy Anaphylaxis   Erythromycin Diarrhea, Nausea And Vomiting  and Hives   Diclofenac Hives    Prior to Admission medications   Medication Sig Start Date End Date Taking? Authorizing Provider  albuterol (PROVENTIL) (2.5 MG/3ML) 0.083% nebulizer solution Take 2.5 mg by nebulization every 4 (four) hours as needed for wheezing or shortness of breath.  05/13/16  Yes [provider]  albuterol (VENTOLIN HFA) 108 (90 Base) MCG/ACT inhaler Inhale 1 puff into the lungs every 6 (six) hours as needed for wheezing or shortness of breath. 03/10/22  Yes [provider]  Ascorbic Acid (VITAMIN C PO) Take 1 tablet by mouth daily.   Yes [provider]  Budeson-Glycopyrrol-Formoterol (BREZTRI AEROSPHERE IN) Inhale 2 puffs into the lungs daily.   Yes [provider]  diazepam (VALIUM) 2 MG tablet Take 2 mg by mouth daily as needed for anxiety.   Yes [provider]  Dupilumab (DUPIXENT) 300 MG/2ML SOPN Inject 300 mg into the skin every 14 (fourteen) days. 01/03/22  Yes Icard, Bradley L, DO  famotidine (PEPCID) 20 MG tablet One after supper Patient taking differently: Take 20 mg by mouth every evening. One after supper 02/12/22  Yes Tanda Rockers, MD  fluticasone [facility]) 50 MCG/ACT nasal spray Place 2 sprays into both nostrils  daily as needed for allergies.   Yes [provider]  levothyroxine (SYNTHROID) 75 MCG tablet Take 75 mcg by mouth every morning. 11/05/21  Yes [provider]  MAGNESIUM PO Take 1 tablet by mouth daily.   Yes [provider]  NUCYNTA 50 MG tablet Take 50 mg by mouth 2 (two) times daily as needed for moderate pain.  11/25/19  Yes [provider]  pantoprazole (PROTONIX) 40 MG tablet Take 1 tablet (40 mg total) by mouth daily. Take 30-60 min before first meal of the day 02/12/22  Yes Tanda Rockers, MD  VITAMIN D PO Take 1 tablet by mouth daily.   Yes [provider]  cetirizine (ZYRTEC ALLERGY) 10 MG tablet Take 1 tablet (10 mg total) by mouth daily. Patient not  taking: Reported on 04/29/2022 03/20/21   Jaynee Eagles, PA-C  chlorpheniramine-HYDROcodone (TUSSIONEX) 10-8 MG/5ML Take 5 mLs by mouth at bedtime as needed for cough. Patient not taking: Reported on 04/29/2022 02/12/22   Tanda Rockers, MD  EPINEPHrine 0.3 mg/0.3 mL IJ SOAJ injection Inject 0.3 mg into the muscle as needed for anaphylaxis.     [provider]  ipratropium (ATROVENT) 0.03 % nasal spray Place 2 sprays into both nostrils 2 (two) times daily. Patient not taking: Reported on 04/29/2022 03/20/21   Jaynee Eagles, PA-C  propranolol (INDERAL) 10 MG tablet Take 1 tablet (10 mg total) by mouth daily as needed (Chest Palpitations). Patient not taking: Reported on 04/29/2022 04/13/21   Lorretta Harp, MD    Social History   Socioeconomic History   Marital status: Married    Spouse name: Not on file   Number of children: Not on file   Years of education: Not on file   Highest education level: Not on file  Occupational History   Not on file  Tobacco Use   Smoking status: Never   Smokeless tobacco: Never  Substance and Sexual Activity   Alcohol use: Yes    Comment: occ only    Drug use: Never   Sexual activity: Not on file  Other Topics Concern   Not on file  Social History Narrative   Not on file   Social Determinants of Health   Financial Resource Strain: Not on file  Food Insecurity: Not on file  Transportation Needs: Not on file  Physical Activity: Not on file  Stress: Not on file  Social Connections: Not on file  Intimate Partner Violence: Not on file     Family History  Problem Relation Age of Onset   Breast cancer Maternal Aunt    Colon cancer Paternal Aunt    Kidney cancer Paternal Grandmother    Bladder Cancer Paternal Grandmother    Pancreatic cancer Paternal Grandfather    Colon cancer Cousin    Colon polyps Neg Hx    Esophageal cancer Neg Hx    Rectal cancer Neg Hx    Stomach cancer Neg Hx     ROS: Otherwise negative unless mentioned in  HPI  Physical Examination  Vitals:   04/29/22 1400 04/29/22 1641  BP: 134/81 (!) 141/82  Pulse: 83 85  Resp: 20   Temp:  97.7 F (36.5 C)  SpO2: 99% 99%   Body mass index is 36.76 kg/m.  General:  WDWN in NAD Gait: Not observed HENT: WNL, normocephalic Pulmonary: normal non-labored breathing, without Rales, rhonchi,  wheezing Cardiac: regular Abdomen:  soft, NT/ND, no masses Skin: without rashes Vascular Exam/Pulses: Symmetrical PT pulses Extremities: Left  leg slightly more edematous than the right however no pitting edema or venous stasis changes Musculoskeletal: no muscle wasting or atrophy  Neurologic: A&O X 3;  No focal weakness or paresthesias are detected; speech is fluent/normal Psychiatric:  The pt has Normal affect. Lymph:  Unremarkable  CBC    Component Value Date/Time   WBC 10.0 04/29/2022 0315   RBC 4.29 04/29/2022 0315   HGB 12.6 04/29/2022 0315   HCT 36.4 04/29/2022 0315   PLT 219 04/29/2022 0315   MCV 84.8 04/29/2022 0315   MCH 29.4 04/29/2022 0315   MCHC 34.6 04/29/2022 0315   RDW 14.5 04/29/2022 0315   LYMPHSABS 3.1 10/24/2020 2255   MONOABS 0.6 10/24/2020 2255   EOSABS 283 10/31/2021 0957   BASOSABS 0.1 10/24/2020 2255    BMET    Component Value Date/Time   NA 137 04/29/2022 0315   K 3.5 04/29/2022 0315   CL 105 04/29/2022 0315   CO2 22 04/29/2022 0315   GLUCOSE 99 04/29/2022 0315   BUN 11 04/29/2022 0315   CREATININE 0.70 04/29/2022 0315   CALCIUM 8.9 04/29/2022 0315   GFRNONAA >60 04/29/2022 0315   GFRAA >60 04/08/2019 1808    COAGS: No results found for: "INR", "PROTIME"   Non-Invasive Vascular Imaging:   Left lower extremity venous duplex demonstrating occlusive thrombus involving the proximal femoral vein; no thrombus noted in the profunda vein, common femoral vein, or iliac veins.    ASSESSMENT/PLAN: This is a 52 y.o. female with DVT of the left leg and bilateral PE  -Venous duplex of the left leg demonstrates occlusive  thrombus extending as high as the proximal femoral vein.  There is no involvement of the common femoral vein or iliac veins.  Patient states pain is much improved since onset of symptoms.  She also believes the edema in the left leg has greatly improved.  On exam she has symmetrical pedal pulses.  She would not be a candidate for thrombectomy given improvement in symptoms as well as no involvement in the common femoral vein or iliac veins.  Okay to transition to [place].  Recommended knee-high compression in the future if she were to develop chronic left lower extremity edema.  Nothing further to add from a vascular surgery standpoint.  On-call vascular surgeon Dr. Carlis Abbott will evaluate the patient later today and provide further treatment plans.   Dagoberto Ligas PA-C [facility] [phone]   I have seen and evaluated the patient. I agree with the PA note as documented above.  52 year old female that presents with evidence of DVT in the left leg with PE.  Vascular surgery has been consulted for evaluation of venous intervention including percutaneous thrombectomy of left leg DVT.  Patient states she has had no history of DVT in the past or other thromboembolic events.  She was diagnosed with COVID about 3 weeks ago.  This has somewhat limited her at home from not feeling well.  She noticed left leg swelling on Friday.  She came to the ED today with worsening shortness of breath.  Noted to have left leg DVT with PE.  I have reviewed the images that show DVT in the left leg however the common femoral vein and profunda veins are patent with no proximal extension.  On exam her leg swelling is improving some on heparin as well as her pain level.  She does endorse some chronic left leg swelling at baseline.  I discussed with no proximal extension and a patent common  femoral and profunda vein I think anticoagulation alone should be adequate for her DVT.  She can be transition to [place].  I recommended  6 months of anticoagulation for first-time provoked DVT thought to be related to her recent COVID diagnosis.  Discussed that she should make sure her cancer screenings are updated.  She can follow-up with [place] as needed.  She has a compression stocking.  Please call vascular with any further questions or concerns.  Marty Heck, MD [facility] [place] Office: [phone]

## 2022-04-29 NOTE — ED Notes (Signed)
ED TO INPATIENT HANDOFF REPORT  ED Nurse Name and Phone #: Richardson Landry [phone]  S Name/Age/Gender Jean Huber 51 y.o. female Room/Bed: 006C/006C  Code Status   Code Status: Full Code  Home/SNF/Other Home Patient oriented to: self, place, time, and situation Is this baseline? Yes   Triage Complete: Triage complete  Chief Complaint DVT (deep venous thrombosis) (HCC) [I82.409] DVT (deep vein thrombosis) in pregnancy [O22.30]  Triage Note Patient arrives with complaints of increased pain to left thigh that has started to impair her mobility. Patient also reports increased swelling site left leg as well.  increased shortness of breath with ambulation.    Allergies Allergies  Allergen Reactions   Iodinated Casein Anaphylaxis   Ivp Dye [Iodinated Contrast Media] Anaphylaxis   Rocephin [Ceftriaxone] Hives and Nausea And Vomiting   Shellfish Allergy Anaphylaxis   Erythromycin Diarrhea, Nausea And Vomiting and Hives   Diclofenac Hives    Level of Care/Admitting Diagnosis ED Disposition     ED Disposition  Admit   Condition  --   Comment  Hospital Area: [place] [[id number]]  Level of Care: Telemetry Medical [104]  May admit patient to [facility] or [facility] if equivalent level of care is available:: No  Covid Evaluation: Asymptomatic - no recent exposure (last 10 days) testing not required  Diagnosis: DVT (deep vein thrombosis) in pregnancy [149702]  Admitting Physician: Kayleen Memos [[id number]]  Attending Physician: KUMAR, PARDEEP [[id number]]  Certification:: I certify this patient will need inpatient services for at least 2 midnights  Estimated Length of Stay: 3          B Medical/Surgery History Past Medical History:  Diagnosis Date   Allergy    Anemia    past hx    Anxiety    Asthma    Constipation    no meds-    COVID-19 virus infection    05-25-2019   GERD (gastroesophageal reflux disease)    past hx - no meds now     Neuromuscular disorder ([place])    fibromyalgia    Palpitations    due to Thyroid    Thyroid disease    Past Surgical History:  Procedure Laterality Date   APPENDECTOMY     CARPAL TUNNEL RELEASE Left    CESAREAN SECTION     x 1    COLONOSCOPY     age 27 in [place] due to diarrhea/ constipation    ROTATOR CUFF REPAIR Right    SPINAL FUSION     L4-L5   TONSILLECTOMY       A IV Location/Drains/Wounds Patient Lines/Drains/Airways Status     Active Line/Drains/Airways     Name Placement date Placement time Site Days   Peripheral IV 04/28/22 20 G Right Antecubital 04/28/22  1553  Antecubital  1   Peripheral IV 04/28/22 22 G Posterior;Right Hand 04/28/22  2225  Hand  1            Intake/Output Last 24 hours No intake or output data in the 24 hours ending 04/29/22 1422  Labs/Imaging Results for orders placed or performed during the hospital encounter of 04/28/22 (from the past 48 hour(s))  CBC     Status: None   Collection Time: 04/28/22  3:54 PM  Result Value Ref Range   WBC 9.8 4.0 - 10.5 K/uL   RBC 4.56 3.87 - 5.11 MIL/uL   Hemoglobin 13.0 12.0 - 15.0 g/dL   HCT 38.8 36.0 - 46.0 %   MCV  85.1 80.0 - 100.0 fL   MCH 28.5 26.0 - 34.0 pg   MCHC 33.5 30.0 - 36.0 g/dL   RDW 14.3 11.5 - 15.5 %   Platelets 216 150 - 400 K/uL   nRBC 0.0 0.0 - 0.2 %    Comment: Performed at [facility], [address] [place], [place] [address]  Basic metabolic panel     Status: None   Collection Time: 04/28/22  3:54 PM  Result Value Ref Range   Sodium 139 135 - 145 mmol/L   Potassium 3.9 3.5 - 5.1 mmol/L   Chloride 104 98 - 111 mmol/L   CO2 26 22 - 32 mmol/L   Glucose, Bld 97 70 - 99 mg/dL    Comment: Glucose reference range applies only to samples taken after fasting for at least 8 hours.   BUN 16 6 - 20 mg/dL   Creatinine, Ser 0.75 0.44 - 1.00 mg/dL   Calcium 9.6 8.9 - 10.3 mg/dL   GFR, Estimated >60 >60 mL/min    Comment: (NOTE) Calculated using the  CKD-EPI Creatinine Equation (2021)    Anion gap 9 5 - 15    Comment: Performed at [facility], [place], [place] [address]  Troponin I (High Sensitivity)     Status: None   Collection Time: 04/28/22  5:30 PM  Result Value Ref Range   Troponin I (High Sensitivity) <2 <18 ng/L    Comment: (NOTE) Elevated high sensitivity troponin I (hsTnI) values and significant  changes across serial measurements may suggest ACS but many other  chronic and acute conditions are known to elevate hsTnI results.  Refer to the "Links" section for chest pain algorithms and additional  guidance. Performed at [facility], [place], [place] [address]   Heparin level (unfractionated)     Status: Abnormal   Collection Time: 04/28/22 10:25 PM  Result Value Ref Range   Heparin Unfractionated >1.10 (H) 0.30 - 0.70 IU/mL    Comment: (NOTE) The clinical reportable range upper limit is being lowered to >1.10 to align with the FDA approved guidance for the current laboratory assay.  If heparin results are below expected values, and patient dosage has  been confirmed, suggest follow up testing of antithrombin III levels. Performed at [facility], [place] [address], [place], [place] [address]   HIV Antibody (routine testing w rflx)     Status: None   Collection Time: 04/28/22 10:25 PM  Result Value Ref Range   HIV Screen 4th Generation wRfx Non Reactive Non Reactive    Comment: Performed at [facility], [place] [address] [place], [place] [address]  Heparin level (unfractionated)     Status: Abnormal   Collection Time: 04/28/22 11:24 PM  Result Value Ref Range   Heparin Unfractionated >1.10 (H) 0.30 - 0.70 IU/mL    Comment: (NOTE) The clinical reportable range upper limit is being lowered to >1.10 to align with the FDA approved guidance for the current laboratory assay.  If heparin results are below expected values, and patient  dosage has  been confirmed, suggest follow up testing of antithrombin III levels. Performed at [facility], [place] [address] [place] [address]   CBC     Status: None   Collection Time: 04/29/22  3:15 AM  Result Value Ref Range   WBC 10.0 4.0 - 10.5 K/uL   RBC 4.29 3.87 - 5.11 MIL/uL   Hemoglobin 12.6 12.0 -  15.0 g/dL   HCT 36.4 36.0 - 46.0 %   MCV 84.8 80.0 - 100.0 fL   MCH 29.4 26.0 - 34.0 pg   MCHC 34.6 30.0 - 36.0 g/dL   RDW 14.5 11.5 - 15.5 %   Platelets 219 150 - 400 K/uL   nRBC 0.0 0.0 - 0.2 %    Comment: Performed at [facility], [place] [address] [place], [place] [address]  Basic metabolic panel     Status: None   Collection Time: 04/29/22  3:15 AM  Result Value Ref Range   Sodium 137 135 - 145 mmol/L   Potassium 3.5 3.5 - 5.1 mmol/L   Chloride 105 98 - 111 mmol/L   CO2 22 22 - 32 mmol/L   Glucose, Bld 99 70 - 99 mg/dL    Comment: Glucose reference range applies only to samples taken after fasting for at least 8 hours.   BUN 11 6 - 20 mg/dL   Creatinine, Ser 0.70 0.44 - 1.00 mg/dL   Calcium 8.9 8.9 - 10.3 mg/dL   GFR, Estimated >60 >60 mL/min    Comment: (NOTE) Calculated using the CKD-EPI Creatinine Equation (2021)    Anion gap 10 5 - 15    Comment: Performed at [facility] [address], [place], [place] [address]  Heparin level (unfractionated)     Status: Abnormal   Collection Time: 04/29/22  9:20 AM  Result Value Ref Range   Heparin Unfractionated >1.10 (H) 0.30 - 0.70 IU/mL    Comment: (NOTE) The clinical reportable range upper limit is being lowered to >1.10 to align with the FDA approved guidance for the current laboratory assay.  If heparin results are below expected values, and patient dosage has  been confirmed, suggest follow up testing of antithrombin III levels. Performed at [facility], [place] [address], [place], [place] [address]    NM Pulmonary Perfusion  Addendum Date: 04/29/2022   ADDENDUM REPORT:  04/29/2022 12:12 ADDENDUM: Critical Value/emergent results were called by telephone at the time of interpretation on 04/29/2022 at 12:11 pm to provider Dr. Dwyane Dee, who verbally acknowledged these results. Electronically Signed   By: Kerby Moors M.D.   On: 04/29/2022 12:12   Result Date: 04/29/2022 CLINICAL DATA:  Evaluate for pulmonary embolus.  Shortness of breath EXAM: NUCLEAR MEDICINE PERFUSION LUNG SCAN TECHNIQUE: Perfusion images were obtained in multiple projections after intravenous injection of radiopharmaceutical. Ventilation scans intentionally deferred if perfusion scan and chest x-ray adequate for interpretation during COVID 19 epidemic. RADIOPHARMACEUTICALS:  4.1 mCi Tc-22mMAA IV COMPARISON:  chest radiograph from 04/28/2022. FINDINGS: There is a large segmental perfusion defect identified involving the right upper lobe. There is also a large segmental perfusion defect involving the anterior basal left upper lobe. IMPRESSION: Exam positive for large segmental perfusion defects compatible with acute pulmonary embolus. Electronically Signed: By: TKerby MoorsM.D. On: 04/29/2022 11:47   UKoreaVenous Img Lower  Left (DVT Study)  Result Date: 04/28/2022 CLINICAL DATA:  Thigh pain and swelling for 3 days EXAM: LEFT LOWER EXTREMITY VENOUS DOPPLER ULTRASOUND TECHNIQUE: Gray-scale sonography with graded compression, as well as color Doppler and duplex ultrasound were performed to evaluate the lower extremity deep venous systems from the level of the common femoral vein and including the common femoral, femoral, profunda femoral, popliteal and calf veins including the posterior tibial, peroneal and gastrocnemius veins when visible. The superficial great saphenous vein was also interrogated. Spectral Doppler was utilized to evaluate flow  at rest and with distal augmentation maneuvers in the common femoral, femoral and popliteal veins. COMPARISON:  None Available. FINDINGS: Contralateral Common Femoral Vein:  Respiratory phasicity is normal and symmetric with the symptomatic side. No evidence of thrombus. Normal compressibility. Common Femoral Vein: No evidence of thrombus. Normal compressibility, respiratory phasicity and response to augmentation. Saphenofemoral Junction: No evidence of thrombus. Normal compressibility and flow on color Doppler imaging. Profunda Femoral Vein: No evidence of thrombus. Normal compressibility and flow on color Doppler imaging. Femoral Vein: Occlusive thrombus extends from the proximal superficial femoral vein through the popliteal vein. Popliteal Vein: Occlusive thrombus extends through the popliteal vein. Calf Veins: Not well assessed. Superficial Great Saphenous Vein: No evidence of thrombus. Normal compressibility. Venous Reflux:  None. Other Findings:  None. IMPRESSION: DVT extends the entire length of the left femoral and popliteal veins. The calf veins were not well assessed. Electronically Signed   By: Dorise Bullion III M.D.   On: 04/28/2022 17:12   DG Chest 2 View  Result Date: 04/28/2022 CLINICAL DATA:  Shortness of breath. EXAM: CHEST - 2 VIEW COMPARISON:  None Available. FINDINGS: The heart size and mediastinal contours are within normal limits. Both lungs are clear. The visualized skeletal structures are unremarkable. IMPRESSION: No active cardiopulmonary disease. Electronically Signed   By: Marijo Conception M.D.   On: 04/28/2022 14:35    Pending Labs Unresulted Labs (From admission, onward)     Start     Ordered   04/30/22 0500  CBC  Daily,   R      04/29/22 1158   04/29/22 2000  Heparin level (unfractionated)  Once-Timed,   TIMED        04/29/22 1158            Vitals/Pain Today's Vitals   04/29/22 0545 04/29/22 0910 04/29/22 1210 04/29/22 1310  BP:  (!) 152/86 131/84   Pulse:  85 81   Resp:  14 12   Temp:  98 F (36.7 C)  98.1 F (36.7 C)  TempSrc:  Oral    SpO2:  99% 97%   Weight:      Height:      PainSc: Asleep       Isolation  Precautions No active isolations  Medications Medications  albuterol (PROVENTIL) (2.5 MG/3ML) 0.083% nebulizer solution 2.5 mg (has no administration in time range)  pantoprazole (PROTONIX) EC tablet 40 mg (40 mg Oral Given 04/29/22 0911)  levothyroxine (SYNTHROID) tablet 75 mcg (75 mcg Oral Given 04/29/22 0544)  famotidine (PEPCID) tablet 40 mg (40 mg Oral Given 04/29/22 0911)  HYDROmorphone (DILAUDID) injection 0.5 mg (0.5 mg Intravenous Given 04/29/22 1203)  oxyCODONE (Oxy IR/ROXICODONE) immediate release tablet 5 mg (5 mg Oral Given 04/29/22 0110)  senna-docusate (Senokot-S) tablet 1 tablet (1 tablet Oral Given 04/28/22 2218)  polyethylene glycol (MIRALAX / GLYCOLAX) packet 17 g (has no administration in time range)  prochlorperazine (COMPAZINE) injection 5 mg (5 mg Intravenous Given 04/29/22 0351)  acetaminophen (TYLENOL) tablet 650 mg (has no administration in time range)  melatonin tablet 5 mg (has no administration in time range)  loratadine (CLARITIN) tablet 10 mg (10 mg Oral Given 04/28/22 2218)  ipratropium (ATROVENT) 0.06 % nasal spray 2 spray (2 sprays Each Nare Not Given 04/29/22 0921)  multivitamin with minerals tablet 1 tablet (1 tablet Oral Not Given 04/29/22 0921)  heparin ADULT infusion 100 units/mL (25000 units/261m) (1,000 Units/hr Intravenous New Bag/Given 04/29/22 1308)  morphine (PF) 4 MG/ML injection 4 mg (4 mg  Intravenous Given 04/28/22 1748)  heparin bolus via infusion 5,500 Units (5,500 Units Intravenous Bolus from Bag 04/28/22 1756)  HYDROmorphone (DILAUDID) injection 0.5 mg (0.5 mg Intravenous Given 04/28/22 2227)  technetium albumin aggregated (MAA) injection solution 4.1 millicurie (4.1 millicuries Intravenous Contrast Given 04/29/22 1105)    Mobility walks     Focused Assessments Peripheral-vascular. Left leg DVT   R Recommendations: See Admitting Provider Note  Report given to:   Additional Notes:

## 2022-04-30 ENCOUNTER — Other Ambulatory Visit (HOSPITAL_COMMUNITY): Payer: Self-pay

## 2022-04-30 DIAGNOSIS — I82462 Acute embolism and thrombosis of left calf muscular vein: Secondary | ICD-10-CM | POA: Diagnosis not present

## 2022-04-30 LAB — CBC
HCT: 37.9 % (ref 36.0–46.0)
Hemoglobin: 12.9 g/dL (ref 12.0–15.0)
MCH: 28.7 pg (ref 26.0–34.0)
MCHC: 34 g/dL (ref 30.0–36.0)
MCV: 84.2 fL (ref 80.0–100.0)
Platelets: 239 10*3/uL (ref 150–400)
RBC: 4.5 MIL/uL (ref 3.87–5.11)
RDW: 14.5 % (ref 11.5–15.5)
WBC: 9.6 10*3/uL (ref 4.0–10.5)
nRBC: 0 % (ref 0.0–0.2)

## 2022-04-30 MED ORDER — OXYCODONE HCL 5 MG PO TABS
5.0000 mg | ORAL_TABLET | ORAL | 0 refills | Status: DC | PRN
  Filled 2022-04-30: qty 6, 1d supply, fill #0

## 2022-04-30 MED ORDER — APIXABAN 5 MG PO TABS: 5.0000 mg | ORAL_TABLET | Freq: Two times a day (BID) | ORAL | Status: DC

## 2022-04-30 MED ORDER — APIXABAN 5 MG PO TABS
ORAL_TABLET | ORAL | 6 refills | Status: DC
  Filled 2022-04-30: qty 60, fill #0

## 2022-04-30 MED ORDER — ONDANSETRON HCL 4 MG PO TABS
4.0000 mg | ORAL_TABLET | Freq: Every day | ORAL | 1 refills | Status: DC | PRN
  Filled 2022-04-30: qty 15, 15d supply, fill #0

## 2022-04-30 MED ORDER — OXYCODONE HCL 5 MG PO TABS: 5.0000 mg | ORAL_TABLET | ORAL | 0 refills | Status: DC | PRN

## 2022-04-30 MED ORDER — APIXABAN 5 MG PO TABS
10.0000 mg | ORAL_TABLET | Freq: Two times a day (BID) | ORAL | Status: DC
  Administered 2022-04-30: 10 mg via ORAL
  Filled 2022-04-30: qty 2

## 2022-04-30 MED ORDER — APIXABAN 5 MG PO TABS
ORAL_TABLET | ORAL | 6 refills | Status: AC
  Filled 2022-04-30: qty 74, 30d supply, fill #0
  Filled 2022-04-30: qty 60, fill #0
  Filled 2022-05-25: qty 74, 30d supply, fill #1

## 2022-04-30 MED ORDER — ONDANSETRON HCL 4 MG PO TABS: 4.0000 mg | ORAL_TABLET | Freq: Every day | ORAL | 1 refills | Status: DC | PRN

## 2022-04-30 MED ORDER — APIXABAN 5 MG PO TABS: ORAL_TABLET | ORAL | 6 refills | Status: DC

## 2022-04-30 NOTE — Discharge Summary (Signed)
Physician Discharge Summary  Jean Huber IRJ:[id number] DOB: 10-09-1970 DOA: 04/28/2022  PCP: Cleatis Polka., MD  Admit date: 04/28/2022  Discharge date: 04/30/2022  Admitted From: Home  Disposition: Home.  Recommendations for Outpatient Follow-up:  Follow up with PCP in 1-2 weeks. Please obtain BMP/CBC in one week. Advised to follow-up with vascular surgery as scheduled. Advised to take Eliquis 10 mg twice daily for 7 days followed by 5 mg twice daily for 6 months for DVT and PE.  Home Health:None Equipment/Devices:None  Discharge Condition: Stable CODE STATUS:Full code Diet recommendation: Heart Healthy  Brief [facility] Course: This 52 years old female with PMH significant for obesity, hypothyroidism, asthma, GERD, COVID-19 infection x 3, last COVID-19 infection was 3 weeks ago who initially presented in the ED with c/o: left leg swelling and pain for 3 days.  She denies any fever, trauma, injury to the leg. She has completed the course of molnupiravir.  She has recently went to a trip that lasted about 2 hours, not on hormone replacement therapy or birth control pills. Workup in the ED reveals extensive DVT involving her left lower extremity.  She also endorses exertional dyspnea for past day or 2.  She does have history of anaphylaxis to IV contrast despite pretreatment.  Due to the concerns for acute pulmonary embolism , Patient was admitted at [facility] and V/Q Scan was completed which was positive for pulmonary embolism. Vascular surgery is consulted. Patient is continued on IV heparin.  Dr. Chestine Spore states that she doesn't not need any vascular intervention, advised anticoagulation for 6 months, Patient feels better and wants to be discharged. Patient is being discharged home.   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 Venous Doppler ultrasound positive for extensive DVT involving left lower extremity on 04/28/2022. Initiated on IV heparin, will subsequently transition to Eliquis before discharge. Vascular surgery is consulted.No intervention, Continue anticoagulation with Eliquis.   Acute pulmonary embolism: Patient continues to have exertional dyspnea. She reports Anaphylactic reaction to IV contrast despite pretreatment. VQ scan high probability for PE. Patient remains on room air, not hypoxic. Continue IV heparin for now.   Obesity: Last weight 101 kg Recommend weight loss outpatient with regular physical activity and healthy dieting.   Hypothyroidism: Continue levothyroxine.   GERD: Continue pantoprazole and famotidine   Asthma: Well controlled. Resume home regimen.  Discharge Instructions  Discharge Instructions     AMB Referral to Deep Vein Thrombosis Clinic   Complete by: As directed    Please call [phone] with any questions.   Is patient currently on anticoagulation?: Yes   Call MD for:  difficulty breathing, headache or visual disturbances   Complete by: As directed    Call MD for:  persistant dizziness or light-headedness   Complete by: As directed    Call MD for:  persistant nausea and vomiting   Complete by: As directed    Diet - low sodium heart healthy   Complete by: As directed    Diet Carb Modified   Complete by: As directed    Discharge instructions   Complete by: As directed    Advised to follow-up with primary care physician in 1 week. Advised to follow-up with vascular surgery as scheduled. Advised to take Eliquis 10 mg twice daily for  7 days followed by 5 mg twice daily for 6 months for DVT and PE.   Increase activity slowly   Complete by: As directed       Allergies as of 04/30/2022       Reactions   Iodinated Casein Anaphylaxis   Ivp Dye [iodinated Contrast Media]  Anaphylaxis   Rocephin [ceftriaxone] Hives, Nausea And Vomiting   Shellfish Allergy Anaphylaxis   Erythromycin Diarrhea, Nausea And Vomiting, Hives   Diclofenac Hives        Medication List     STOP taking these medications    cetirizine 10 MG tablet Commonly known as: ZyrTEC Allergy   chlorpheniramine-HYDROcodone 10-8 MG/5ML Commonly known as: TUSSIONEX   ipratropium 0.03 % nasal spray Commonly known as: ATROVENT   propranolol 10 MG tablet Commonly known as: INDERAL       TAKE these medications    albuterol (2.5 MG/3ML) 0.083% nebulizer solution Commonly known as: PROVENTIL Take 2.5 mg by nebulization every 4 (four) hours as needed for wheezing or shortness of breath.   albuterol 108 (90 Base) MCG/ACT inhaler Commonly known as: VENTOLIN HFA Inhale 1 puff into the lungs every 6 (six) hours as needed for wheezing or shortness of breath.   apixaban 5 MG Tabs tablet Commonly known as: ELIQUIS Advised to take Eliquis 10 mg twice daily for 7 days followed by 5 mg twice daily for 6 months. Start taking on: May 07, 2022   BREZTRI AEROSPHERE IN Inhale 2 puffs into the lungs daily.   diazepam 2 MG tablet Commonly known as: VALIUM Take 2 mg by mouth daily as needed for anxiety.   Dupixent 300 MG/2ML Sopn Generic drug: Dupilumab Inject 300 mg into the skin every 14 (fourteen) days.   EPINEPHrine 0.3 mg/0.3 mL Soaj injection Commonly known as: EPI-PEN Inject 0.3 mg into the muscle as needed for anaphylaxis.   famotidine 20 MG tablet Commonly known as: Pepcid One after supper What changed:  how much to take how to take this when to take this   fluticasone 50 MCG/ACT nasal spray Commonly known as: FLONASE Place 2 sprays into both nostrils daily as needed for allergies.   levothyroxine 75 MCG tablet Commonly known as: SYNTHROID Take 75 mcg by mouth every morning.   MAGNESIUM PO Take 1 tablet by mouth daily.   Nucynta 50 MG tablet Generic drug:  tapentadol Take 50 mg by mouth 2 (two) times daily as needed for moderate pain.   ondansetron 4 MG tablet Commonly known as: Zofran Take 1 tablet (4 mg total) by mouth daily as needed for nausea or vomiting.   oxyCODONE 5 MG immediate release tablet Commonly known as: Oxy IR/ROXICODONE Take 1 tablet (5 mg total) by mouth every 4 (four) hours as needed for moderate pain or breakthrough pain.   pantoprazole 40 MG tablet Commonly known as: Protonix Take 1 tablet (40 mg total) by mouth daily. Take 30-60 min before first meal of the day   VITAMIN C PO Take 1 tablet by mouth daily.   VITAMIN D PO Take 1 tablet by mouth daily.        Follow-up Information     Cleatis Polka., MD Follow up in 1 week(s).   Specialty: Internal Medicine Contact information: [address] [place] [address] [phone]         Cephus Shelling, MD Follow up in 1 month(s).   Specialty: Vascular Surgery Contact information: [address] [place] [address] [phone]  Allergies  Allergen Reactions   Iodinated Casein Anaphylaxis   Ivp Dye [Iodinated Contrast Media] Anaphylaxis   Rocephin [Ceftriaxone] Hives and Nausea And Vomiting   Shellfish Allergy Anaphylaxis   Erythromycin Diarrhea, Nausea And Vomiting and Hives   Diclofenac Hives    Consultations: Vascular surgery   Procedures/Studies: NM Pulmonary Perfusion  Addendum Date: 04/29/2022   ADDENDUM REPORT: 04/29/2022 12:12 ADDENDUM: Critical Value/emergent results were called by telephone at the time of interpretation on 04/29/2022 at 12:11 pm to provider Dr. Lucianne Muss, who verbally acknowledged these results. Electronically Signed   By: Signa Kell M.D.   On: 04/29/2022 12:12   Result Date: 04/29/2022 CLINICAL DATA:  Evaluate for pulmonary embolus.  Shortness of breath EXAM: NUCLEAR MEDICINE PERFUSION LUNG SCAN TECHNIQUE: Perfusion images were obtained in multiple projections after intravenous  injection of radiopharmaceutical. Ventilation scans intentionally deferred if perfusion scan and chest x-ray adequate for interpretation during COVID 19 epidemic. RADIOPHARMACEUTICALS:  4.1 mCi Tc-10m MAA IV COMPARISON:  chest radiograph from 04/28/2022. FINDINGS: There is a large segmental perfusion defect identified involving the right upper lobe. There is also a large segmental perfusion defect involving the anterior basal left upper lobe. IMPRESSION: Exam positive for large segmental perfusion defects compatible with acute pulmonary embolus. Electronically Signed: By: Signa Kell M.D. On: 04/29/2022 11:47   US Venous Img Lower  Left (DVT Study)  Result Date: 04/28/2022 CLINICAL DATA:  Thigh pain and swelling for 3 days EXAM: LEFT LOWER EXTREMITY VENOUS DOPPLER ULTRASOUND TECHNIQUE: Gray-scale sonography with graded compression, as well as color Doppler and duplex ultrasound were performed to evaluate the lower extremity deep venous systems from the level of the common femoral vein and including the common femoral, femoral, profunda femoral, popliteal and calf veins including the posterior tibial, peroneal and gastrocnemius veins when visible. The superficial great saphenous vein was also interrogated. Spectral Doppler was utilized to evaluate flow at rest and with distal augmentation maneuvers in the common femoral, femoral and popliteal veins. COMPARISON:  None Available. FINDINGS: Contralateral Common Femoral Vein: Respiratory phasicity is normal and symmetric with the symptomatic side. No evidence of thrombus. Normal compressibility. Common Femoral Vein: No evidence of thrombus. Normal compressibility, respiratory phasicity and response to augmentation. Saphenofemoral Junction: No evidence of thrombus. Normal compressibility and flow on color Doppler imaging. Profunda Femoral Vein: No evidence of thrombus. Normal compressibility and flow on color Doppler imaging. Femoral Vein: Occlusive thrombus  extends from the proximal superficial femoral vein through the popliteal vein. Popliteal Vein: Occlusive thrombus extends through the popliteal vein. Calf Veins: Not well assessed. Superficial Great Saphenous Vein: No evidence of thrombus. Normal compressibility. Venous Reflux:  None. Other Findings:  None. IMPRESSION: DVT extends the entire length of the left femoral and popliteal veins. The calf veins were not well assessed. Electronically Signed   By: Gerome Sam III M.D.   On: 04/28/2022 17:12   DG Chest 2 View  Result Date: 04/28/2022 CLINICAL DATA:  Shortness of breath. EXAM: CHEST - 2 VIEW COMPARISON:  None Available. FINDINGS: The heart size and mediastinal contours are within normal limits. Both lungs are clear. The visualized skeletal structures are unremarkable. IMPRESSION: No active cardiopulmonary disease. Electronically Signed   By: Lupita Raider M.D.   On: 04/28/2022 14:35      Subjective: Patient was seen and examined at bed side, feels better, and wants to be discharged home.  Discharge Exam: Vitals:   04/30/22 0822 04/30/22 0911  BP:  131/83  Pulse:  71  Resp:  18   Temp:  97.8 F (36.6 C)  SpO2: 99% 99%   Vitals:   04/29/22 2014 04/30/22 0340 04/30/22 0822 04/30/22 0911  BP: 135/79 133/81  131/83  Pulse: 88 83  71  Resp: 16 11 18    Temp: 98.1 F (36.7 C) 97.6 F (36.4 C)  97.8 F (36.6 C)  TempSrc: Oral Axillary  Oral  SpO2: 100% 99% 99% 99%  Weight:      Height:        General: Pt is alert, awake, not in acute distress Cardiovascular: RRR, S1/S2 +, no rubs, no gallops Respiratory: CTA bilaterally, no wheezing, no rhonchi Abdominal: Soft, NT, ND, bowel sounds + Extremities: no edema, no cyanosis    The results of significant diagnostics from this hospitalization (including imaging, microbiology, ancillary and laboratory) are listed below for reference.     Microbiology: No results found for this or any previous visit (from the past 240 hour(s)).    Labs: BNP (last 3 results) No results for input(s): "BNP" in the last 8760 hours. Basic Metabolic Panel: Recent Labs  Lab 04/28/22 1554 04/29/22 0315  NA 139 137  K 3.9 3.5  CL 104 105  CO2 26 22  GLUCOSE 97 99  BUN 16 11  CREATININE 0.75 0.70  CALCIUM 9.6 8.9   Liver Function Tests: No results for input(s): "AST", "ALT", "ALKPHOS", "BILITOT", "PROT", "ALBUMIN" in the last 168 hours. No results for input(s): "LIPASE", "AMYLASE" in the last 168 hours. No results for input(s): "AMMONIA" in the last 168 hours. CBC: Recent Labs  Lab 04/28/22 1554 04/29/22 0315 04/30/22 0315  WBC 9.8 10.0 9.6  HGB 13.0 12.6 12.9  HCT 38.8 36.4 37.9  MCV 85.1 84.8 84.2  PLT 216 219 239   Cardiac Enzymes: No results for input(s): "CKTOTAL", "CKMB", "CKMBINDEX", "TROPONINI" in the last 168 hours. BNP: Invalid input(s): "POCBNP" CBG: No results for input(s): "GLUCAP" in the last 168 hours. D-Dimer No results for input(s): "DDIMER" in the last 72 hours. Hgb A1c No results for input(s): "HGBA1C" in the last 72 hours. Lipid Profile No results for input(s): "CHOL", "HDL", "LDLCALC", "TRIG", "CHOLHDL", "LDLDIRECT" in the last 72 hours. Thyroid function studies No results for input(s): "TSH", "T4TOTAL", "T3FREE", "THYROIDAB" in the last 72 hours.  Invalid input(s): "FREET3" Anemia work up No results for input(s): "VITAMINB12", "FOLATE", "FERRITIN", "TIBC", "IRON", "RETICCTPCT" in the last 72 hours. Urinalysis No results found for: "COLORURINE", "APPEARANCEUR", "LABSPEC", "PHURINE", "GLUCOSEU", "HGBUR", "BILIRUBINUR", "KETONESUR", "PROTEINUR", "UROBILINOGEN", "NITRITE", "LEUKOCYTESUR" Sepsis Labs Recent Labs  Lab 04/28/22 1554 04/29/22 0315 04/30/22 0315  WBC 9.8 10.0 9.6   Microbiology No results found for this or any previous visit (from the past 240 hour(s)).   Time coordinating discharge: Over 30 minutes  SIGNED:   Cipriano Bunker, MD  [facility] 04/30/2022, 12:06  PM Pager   If 7PM-7AM, please contact night-coverage

## 2022-04-30 NOTE — Discharge Instructions (Signed)
Information on my medicine - ELIQUIS (apixaban)  This medication education was reviewed with me or my healthcare representative as part of my discharge preparation.  The pharmacist that spoke with me during my hospital stay was:  Einar Grad, [facility]  Why was Eliquis prescribed for you? Eliquis was prescribed to treat blood clots that may have been found in the veins of your legs (deep vein thrombosis) or in your lungs (pulmonary embolism) and to reduce the risk of them occurring again.  What do You need to know about Eliquis ? The starting dose is 10 mg (two 5 mg tablets) taken TWICE daily for the FIRST SEVEN (7) DAYS, then on (enter date)  05/07/22  the dose is reduced to ONE 5 mg tablet taken TWICE daily.  Eliquis may be taken with or without food.   Try to take the dose about the same time in the morning and in the evening. If you have difficulty swallowing the tablet whole please discuss with your pharmacist how to take the medication safely.  Take Eliquis exactly as prescribed and DO NOT stop taking Eliquis without talking to the doctor who prescribed the medication.  Stopping may increase your risk of developing a new blood clot.  Refill your prescription before you run out.  After discharge, you should have regular check-up appointments with your healthcare provider that is prescribing your Eliquis.    What do you do if you miss a dose? If a dose of ELIQUIS is not taken at the scheduled time, take it as soon as possible on the same day and twice-daily administration should be resumed. The dose should not be doubled to make up for a missed dose.  Important Safety Information A possible side effect of Eliquis is bleeding. You should call your healthcare provider right away if you experience any of the following: Bleeding from an injury or your nose that does not stop. Unusual colored urine (red or dark brown) or unusual colored stools (red or black). Unusual bruising for  unknown reasons. A serious fall or if you hit your head (even if there is no bleeding).  Some medicines may interact with Eliquis and might increase your risk of bleeding or clotting while on Eliquis. To help avoid this, consult your healthcare provider or pharmacist prior to using any new prescription or non-prescription medications, including herbals, vitamins, non-steroidal anti-inflammatory drugs (NSAIDs) and supplements.  This website has more information on Eliquis (apixaban): http://[email or web address].com/eliquis/home

## 2022-04-30 NOTE — Progress Notes (Addendum)
ANTICOAGULATION CONSULT NOTE Pharmacy Consult for Eliquis Indication: DVT Brief A/P: Eliquis 10 mg BID x 7 days, then 5 mg BID  Allergies  Allergen Reactions   Iodinated Casein Anaphylaxis   Ivp Dye [Iodinated Contrast Media] Anaphylaxis   Rocephin [Ceftriaxone] Hives and Nausea And Vomiting   Shellfish Allergy Anaphylaxis   Erythromycin Diarrhea, Nausea And Vomiting and Hives   Diclofenac Hives    Patient Measurements: Height: '5\' 5"'$  (165.1 cm) Weight: 100.2 kg (220 lb 14.4 oz) IBW/kg (Calculated) : 57  Vital Signs: Temp: 97.6 F (36.4 C) (01/30 0340) Temp Source: Axillary (01/30 0340) BP: 133/81 (01/30 0340) Pulse Rate: 83 (01/30 0340)  Labs: Recent Labs    04/28/22 1554 04/28/22 1730 04/28/22 2225 04/28/22 2324 04/29/22 0315 04/29/22 0920 04/29/22 1938 04/30/22 0315  HGB 13.0  --   --   --  12.6  --   --  12.9  HCT 38.8  --   --   --  36.4  --   --  37.9  PLT 216  --   --   --  219  --   --  239  HEPARINUNFRC  --   --    < > >1.10*  --  >1.10* 0.52  --   CREATININE 0.75  --   --   --  0.70  --   --   --   TROPONINIHS  --  <2  --   --   --   --   --   --    < > = values in this interval not displayed.     Estimated Creatinine Clearance: 95.4 mL/min (by C-G formula based on SCr of 0.7 mg/dL).  Assessment: 52 y.o. female with DVT for Eliquis  Plan:  Eliquis 10 mg BID x 7 days, then 5 mg BID  04/30/2022 6:36 AM

## 2022-04-30 NOTE — TOC Benefit Eligibility Note (Signed)
Patient Teacher, English as a foreign language completed.    The patient is currently admitted and upon discharge could be taking Eliquis 5 mg.  The current 30 day co-pay is $20.00.   The patient is insured through [employer] ([facility] is the only [facility] pharmacy that takes her insurance)  Lyndel Safe, [place] Patient Advocate Specialist [place] Patient Advocate Team Direct Number: [phone]  Fax: [phone]

## 2022-04-30 NOTE — Plan of Care (Signed)
  Problem: Education: Goal: Knowledge of General Education information will improve Description: Including pain rating scale, medication(s)/side effects and non-pharmacologic comfort measures Outcome: Adequate for Discharge   Problem: Health Behavior/Discharge Planning: Goal: Ability to manage health-related needs will improve Outcome: Adequate for Discharge   Problem: Clinical Measurements: Goal: Ability to maintain clinical measurements within normal limits will improve Outcome: Adequate for Discharge Goal: Will remain free from infection Outcome: Adequate for Discharge Goal: Diagnostic test results will improve Outcome: Adequate for Discharge Goal: Respiratory complications will improve Outcome: Adequate for Discharge Goal: Cardiovascular complication will be avoided Outcome: Adequate for Discharge   Problem: Activity: Goal: Risk for activity intolerance will decrease Outcome: Adequate for Discharge   Problem: Nutrition: Goal: Adequate nutrition will be maintained Outcome: Adequate for Discharge   Problem: Coping: Goal: Level of anxiety will decrease Outcome: Adequate for Discharge   Problem: Elimination: Goal: Will not experience complications related to bowel motility Outcome: Adequate for Discharge Goal: Will not experience complications related to urinary retention Outcome: Adequate for Discharge   Problem: Pain Managment: Goal: General experience of comfort will improve Outcome: Adequate for Discharge   Problem: Safety: Goal: Ability to remain free from injury will improve Outcome: Adequate for Discharge

## 2022-05-01 NOTE — Progress Notes (Deleted)
Cardiology Office Note:    Date:  05/01/2022   ID:  Jean Huber, DOB 1970/11/08, MRN [id number]  PCP:  Cleatis Polka., MD  Cardiologist:  None  Electrophysiologist:  None   Referring MD: Cleatis Polka., MD   Chief Complaint: hospital follow-up of DVT  History of Present Illness:    Jean Huber is a 52 y.o. female with a history of palpitations with short runs of SVT and rare PACs note don monitor in 12/2020, recent diagnosis of acute DVT/ PE in 04/2022 on Eliquis, Graves' disease s/p radioactive iodine treatment now on Synthroid, asthma, GERD, fibromyalgia, and anxiety who is followed by Dr. Allyson Sabal and presents today for hospital follow-up of DVT.  Patient was referred to Dr. Allyson Sabal in 12/2020 for further evaluation of palpitations.She reported her resting heart rate in the morning was 130 bpm at that time. She also reported occasional atypical chest pain at that time. 2 week Zio monitor, Echo, and coronary calcium score were ordered for further evaluation.  Monitor showed underlying sinus rhythm with average heart rate of 80 bpm with short runs of SVT and rare PACs. Echo showed LVEF of 60-65% with normal wall motion and diastolic parameters, normal RV, and no significant valvular disease. CT cardiac scoring showed a coronary calcium score of 0. Patient was last seen by Dr. Allyson Sabal in 04/2021 at which time her palpitations were unchanged. She was restarted on Propranolol 10mg  as needed for palpitations which she had been on in the past and found helpful.   She was recently admitted from 04/28/2022 to 04/30/2022 for left leg swelling and pain for 3 days. She had recently gone on a trip that was about 2 hours in duration. Lower extremity doppler of the left leg showed an acute DVT that extended the entire length of the left femoral and popliteal veins. She also reported exertional dyspnea for the last couple of days; therefore, she was started on IV Heparin and admitted due to  concerns for acute PE. V/Q scan was performed due anaphylaxis to IV contrast despite pretreatment and showed large segmental perfusion defects compatible with an acute PE. Vascular surgery was consulted due to extensive DVT. She was not felt to need any vascular intervention and recommended anticoagulation for 6 months. She was discharged on Eliquis.  Patient presents today for follow-up. ***  Acute DVT/PE Patient was recently admitted for extensive left lower extremity edema and acute PE. Vascular Surgery was consulted and did not feel like any vascular intervention was needed.  - *** - Continue treatment dose of Eliquis. Vascular Surgery recommended 6 months of anticoagulation.   Palpitations Patient has a history of palpitations. Monitor in 12/2020 showed underlying sinus rhythm with average heart rate of 80 bpm with short runs of SVT and rare PACs. - Stable. *** - Continue Propranolol 10mg  daily as needed for palpitations. ***   Past Medical History:  Diagnosis Date   Allergy    Anemia    past hx    Anxiety    Asthma    Constipation    no meds-    COVID-19 virus infection    05-25-2019   GERD (gastroesophageal reflux disease)    past hx - no meds now    Neuromuscular disorder (HCC)    fibromyalgia    Palpitations    due to Thyroid    Thyroid disease     Past Surgical History:  Procedure Laterality Date   APPENDECTOMY  CARPAL TUNNEL RELEASE Left    CESAREAN SECTION     x 1    COLONOSCOPY     age 34 in [place] due to diarrhea/ constipation    ROTATOR CUFF REPAIR Right    SPINAL FUSION     L4-L5   TONSILLECTOMY      Current Medications: No outpatient medications have been marked as taking for the 05/09/22 encounter (Appointment) with Corrin Parker, PA-C.     Allergies:   Iodinated casein, Ivp dye [iodinated contrast media], Rocephin [ceftriaxone], Shellfish allergy, Erythromycin, and Diclofenac   Social History   Socioeconomic History   Marital  status: Married    Spouse name: Not on file   Number of children: Not on file   Years of education: Not on file   Highest education level: Not on file  Occupational History   Not on file  Tobacco Use   Smoking status: Never   Smokeless tobacco: Never  Substance and Sexual Activity   Alcohol use: Yes    Comment: occ only    Drug use: Never   Sexual activity: Not on file  Other Topics Concern   Not on file  Social History Narrative   Not on file   Social Determinants of Health   Financial Resource Strain: Not on file  Food Insecurity: No Food Insecurity (04/29/2022)   Hunger Vital Sign    Worried About Running Out of Food in the Last Year: Never true    Ran Out of Food in the Last Year: Never true  Transportation Needs: No Transportation Needs (04/29/2022)   PRAPARE - Administrator, Civil Service (Medical): No    Lack of Transportation (Non-Medical): No  Physical Activity: Not on file  Stress: Not on file  Social Connections: Not on file     Family History: The patient's family history includes Bladder Cancer in her paternal grandmother; Breast cancer in her maternal aunt; Colon cancer in her cousin and paternal aunt; Kidney cancer in her paternal grandmother; Pancreatic cancer in her paternal grandfather. There is no history of Colon polyps, Esophageal cancer, Rectal cancer, or Stomach cancer.  ROS:   Please see the history of present illness.     EKGs/Labs/Other Studies Reviewed:    The following studies were reviewed today:  Monitor 12/2020: Patch Wear Time:  13 days and 20 hours (2022-10-15T13:51:25-0400 to 2022-10-29T10:17:54-0400)   Patient had a min HR of 52 bpm, max HR of 144 bpm, and avg HR of 80 bpm. Predominant underlying rhythm was Sinus Rhythm. 1 run of Supraventricular Tachycardia occurred lasting 9 beats with a max rate of 144 bpm (avg 129 bpm). Isolated SVEs were rare  (<1.0%), SVE Couplets were rare (<1.0%), and SVE Triplets were rare  (<1.0%). Isolated VEs were rare (<1.0%), and no VE Couplets or VE Triplets were present.    1. SR/SB/ST 2. Rare PACs 3. Short runs of SVT _______________  Echocardiogram 02/09/2021: Impressions: 1. Left ventricular ejection fraction, by estimation, is 60 to 65%. The  left ventricle has normal function. The left ventricle has no regional  wall motion abnormalities. Left ventricular diastolic parameters were  normal.   2. Right ventricular systolic function is normal. The right ventricular  size is normal.   3. The mitral valve is normal in structure. No evidence of mitral valve  regurgitation. No evidence of mitral stenosis.   4. The aortic valve is tricuspid. Aortic valve regurgitation is not  visualized. No aortic stenosis is present.  _______________  CT Cardiac Scoring 02/27/2021: Coronary calcium score of 0. This was 0 percentile for age-, race-, and sex-matched controls. _______________  Lower Extremity Venous Doppler 04/28/2022: Impressions: DVT extends the entire length of the left femoral and popliteal veins. The calf veins were not well assessed. _______________  V/Q Scan 04/29/2022: Impressions: Exam positive for large segmental perfusion defects compatible with acute pulmonary embolus.  EKG:  EKG not ordered today.   Recent Labs: 04/29/2022: BUN 11; Creatinine, Ser 0.70; Potassium 3.5; Sodium 137 04/30/2022: Hemoglobin 12.9; Platelets 239  Recent Lipid Panel No results found for: "CHOL", "TRIG", "HDL", "CHOLHDL", "VLDL", "LDLCALC", "LDLDIRECT"  Physical Exam:    Vital Signs: LMP 07/04/2019 (Approximate) Comment: husband has vascectomy    Wt Readings from Last 3 Encounters:  04/29/22 220 lb 14.4 oz (100.2 kg)  02/12/22 223 lb (101.2 kg)  01/03/22 224 lb 6.4 oz (101.8 kg)     General: 52 y.o. female in no acute distress. HEENT: Normocephalic and atraumatic. Sclera clear. EOMs intact. Neck: Supple. No carotid bruits. No JVD. Heart: *** RRR. Distinct S1  and S2. No murmurs, gallops, or rubs. Radial and distal pedal pulses 2+ and equal bilaterally. Lungs: No increased work of breathing. Clear to ausculation bilaterally. No wheezes, rhonchi, or rales.  Abdomen: Soft, non-distended, and non-tender to palpation. Bowel sounds present in all 4 quadrants.  MSK: Normal strength and tone for age. *** Extremities: No lower extremity edema.    Skin: Warm and dry. Neuro: Alert and oriented x3. No focal deficits. Psych: Normal affect. Responds appropriately.   Assessment:    No diagnosis found.  Plan:     Disposition: Follow up in ***   Medication Adjustments/Labs and Tests Ordered: Current medicines are reviewed at length with the patient today.  Concerns regarding medicines are outlined above.  No orders of the defined types were placed in this encounter.  No orders of the defined types were placed in this encounter.   There are no Patient Instructions on file for this visit.   Signed, Corrin Parker, PA-C  05/01/2022 10:07 AM    [facility]

## 2022-05-02 ENCOUNTER — Encounter: Payer: Self-pay | Admitting: Pulmonary Disease

## 2022-05-02 NOTE — Telephone Encounter (Signed)
Received the following message from patient:   "Dr. Valeta Harms,   Good morning.  I wanted to reach out because I was just released from the hospital due to a DVT in my left leg and a PE in both lungs.   I had Covid at the beginning of January and they think that attributed to the clots.     I have an appointment with you in April, but I wanted to see if I needed to come in to be evaluated any sooner due to the damage to my lungs?     Thank you Jean Huber"  I asked her how she was doing and this was her response:   "Thank you. I'm starting to feel better. Just very tired and I have been coughing a lot. Not sure if that is related or I'm getting sick from being around something in the hospital. I do have shortness of breath due to the PE, but hopefully that will get better over time."  From her chart, it looks like she was in the hospital 01/28-01/30 for the DVT.   Dr. Valeta Harms, can you please advise?

## 2022-05-08 ENCOUNTER — Encounter: Payer: Self-pay | Admitting: Vascular Surgery

## 2022-05-08 ENCOUNTER — Ambulatory Visit: Payer: Commercial Managed Care - PPO | Admitting: Vascular Surgery

## 2022-05-08 ENCOUNTER — Other Ambulatory Visit: Payer: Self-pay

## 2022-05-08 ENCOUNTER — Ambulatory Visit (HOSPITAL_BASED_OUTPATIENT_CLINIC_OR_DEPARTMENT_OTHER)
Admission: RE | Admit: 2022-05-08 | Discharge: 2022-05-08 | Disposition: A | Discharge status: Home / Self Care | Payer: Commercial Managed Care - PPO | Source: Ambulatory Visit

## 2022-05-08 ENCOUNTER — Ambulatory Visit (HOSPITAL_COMMUNITY)
Admission: RE | Admit: 2022-05-08 | Discharge: 2022-05-08 | Disposition: A | Discharge status: Home / Self Care | Payer: Commercial Managed Care - PPO | Source: Ambulatory Visit | Attending: Vascular Surgery | Admitting: Vascular Surgery

## 2022-05-08 VITALS — BP 126/88 | HR 88

## 2022-05-08 VITALS — BP 133/85 | HR 89 | Temp 98.2°F | Resp 20 | Ht 65.0 in | Wt 220.0 lb

## 2022-05-08 DIAGNOSIS — I82512 Chronic embolism and thrombosis of left femoral vein: Secondary | ICD-10-CM | POA: Diagnosis not present

## 2022-05-08 DIAGNOSIS — M7989 Other specified soft tissue disorders: Secondary | ICD-10-CM | POA: Insufficient documentation

## 2022-05-08 DIAGNOSIS — Z8616 Personal history of COVID-19: Secondary | ICD-10-CM | POA: Diagnosis not present

## 2022-05-08 DIAGNOSIS — R609 Edema, unspecified: Secondary | ICD-10-CM | POA: Insufficient documentation

## 2022-05-08 DIAGNOSIS — I82412 Acute embolism and thrombosis of left femoral vein: Secondary | ICD-10-CM

## 2022-05-08 DIAGNOSIS — J45909 Unspecified asthma, uncomplicated: Secondary | ICD-10-CM | POA: Insufficient documentation

## 2022-05-08 DIAGNOSIS — I87002 Postthrombotic syndrome without complications of left lower extremity: Secondary | ICD-10-CM | POA: Diagnosis not present

## 2022-05-08 DIAGNOSIS — Z7901 Long term (current) use of anticoagulants: Secondary | ICD-10-CM | POA: Diagnosis not present

## 2022-05-08 DIAGNOSIS — Z79899 Other long term (current) drug therapy: Secondary | ICD-10-CM | POA: Insufficient documentation

## 2022-05-08 DIAGNOSIS — R0602 Shortness of breath: Secondary | ICD-10-CM | POA: Diagnosis not present

## 2022-05-08 DIAGNOSIS — I2699 Other pulmonary embolism without acute cor pulmonale: Secondary | ICD-10-CM | POA: Diagnosis not present

## 2022-05-08 DIAGNOSIS — I824Z2 Acute embolism and thrombosis of unspecified deep veins of left distal lower extremity: Secondary | ICD-10-CM | POA: Diagnosis not present

## 2022-05-08 DIAGNOSIS — E669 Obesity, unspecified: Secondary | ICD-10-CM | POA: Insufficient documentation

## 2022-05-08 DIAGNOSIS — Z86711 Personal history of pulmonary embolism: Secondary | ICD-10-CM | POA: Diagnosis not present

## 2022-05-08 NOTE — Patient Instructions (Addendum)
-  You have a repeat study at 10:30 today at the hospital and an appointment at [facility] at 12:20pm today - [address] [place], [place] [address].  -Continue Eliquis to complete 6 months. Follow up with your PCP at that time.  -It is important to take your medication around the same time every day.  -Avoid NSAIDs like ibuprofen (Advil, Motrin) and naproxen (Aleve) as well as aspirin doses over 100 mg daily. -Tylenol (acetaminophen) is the preferred over the counter pain medication to lower the risk of bleeding. -Be sure to alert all of your health care providers that you are taking an anticoagulant prior to starting a new medication or having a procedure. -Monitor for signs and symptoms of bleeding (abnormal bruising, prolonged bleeding, nose bleeds, bleeding from gums, discolored urine, black tarry stools). If you have fallen and hit your head OR if your bleeding is severe or not stopping, seek emergency care.  -Go to the emergency room if emergent signs and symptoms of new clot occur (new or worse swelling and pain in an arm or leg, shortness of breath, chest pain, fast or irregular heartbeats, lightheadedness, dizziness, fainting, coughing up blood) or if you experience a significant color change (pale or blue) in the extremity that has the DVT.  -We recommend you wear thigh high compression stockings (20-30 mmHg) as long as you are having swelling or pain. Be sure to purchase the correct size and take them off at night.   If you are having an emergency, call 911 or present to the nearest emergency room.   What is a DVT?  -Deep vein thrombosis (DVT) is a condition in which a blood clot forms in a vein of the deep venous system which can occur in the lower leg, thigh, pelvis, arm, or neck. This condition is serious and can be life-threatening if the clot travels to the arteries of the lungs and causing a blockage (pulmonary embolism, PE). A DVT can also damage veins in the leg, which can lead to  long-term venous disease, leg pain, swelling, discoloration, and ulcers or sores (post-thrombotic syndrome).  -Treatment may include taking an anticoagulant medication to prevent more clots from forming and the current clot from growing, wearing compression stockings, and/or surgical procedures to remove or dissolve the clot.

## 2022-05-08 NOTE — Progress Notes (Signed)
[name], female   DOB: 1970-12-19, 52 y.o.   MRN: [id number]  Reason for Consult: No chief complaint on file.   Referred by Cleatis Polka., MD  Subjective:     HPI:  Jean Huber is a 52 y.o. female without significant history of DVT.  She recently had her third COVID found to have swelling which she says her left leg was at least twice the size possibly 4 times its normal size.  She does have underlying size discrepancy of the left being greater than the right lower extremity.  She has had DVT studies in the past which were negative.  Her grandmother did have a DVT but no other personal or family history.  After her DVT she now has left medial thigh pain.  She does not have any skin changes and denies any fevers or chills.  She has been compliant with Eliquis.  Past Medical History:  Diagnosis Date   Allergy    Anemia    past hx    Anxiety    Asthma    Constipation    no meds-    COVID-19 virus infection    05-25-2019   GERD (gastroesophageal reflux disease)    past hx - no meds now    Neuromuscular disorder (HCC)    fibromyalgia    Palpitations    due to Thyroid    Thyroid disease    Family History  Problem Relation Age of Onset   Breast cancer Maternal Aunt    Colon cancer Paternal Aunt    Kidney cancer Paternal Grandmother    Bladder Cancer Paternal Grandmother    Pancreatic cancer Paternal Grandfather    Colon cancer Cousin    Colon polyps Neg Hx    Esophageal cancer Neg Hx    Rectal cancer Neg Hx    Stomach cancer Neg Hx    Past Surgical History:  Procedure Laterality Date   APPENDECTOMY     CARPAL TUNNEL RELEASE Left    CESAREAN SECTION     x 1    COLONOSCOPY     age 71 in [place] due to diarrhea/ constipation    ROTATOR CUFF REPAIR Right    SPINAL FUSION     L4-L5   TONSILLECTOMY      Short Social History:  Social History   Tobacco Use   Smoking status: Never   Smokeless tobacco: Never  Substance Use Topics    Alcohol use: Yes    Comment: occ only     Allergies  Allergen Reactions   Iodinated Casein Anaphylaxis   Ivp Dye [Iodinated Contrast Media] Anaphylaxis   Rocephin [Ceftriaxone] Hives and Nausea And Vomiting   Shellfish Allergy Anaphylaxis   Erythromycin Hives, Diarrhea and Nausea And Vomiting   Diclofenac Hives    Current Outpatient Medications  Medication Sig Dispense Refill   albuterol (PROVENTIL) (2.5 MG/3ML) 0.083% nebulizer solution Take 2.5 mg by nebulization every 4 (four) hours as needed for wheezing or shortness of breath.      albuterol (VENTOLIN HFA) 108 (90 Base) MCG/ACT inhaler Inhale 1 puff into the lungs every 6 (six) hours as needed for wheezing or shortness of breath.     apixaban (ELIQUIS) 5 MG TABS tablet Take 2 tablets (10 mg) twice daily for 7 days then take 1 tablet (5 mg) twice daily for 6 months. (Patient taking differently: Take 5 mg by mouth 2 (two) times daily.) 60 tablet 6   Ascorbic Acid (  VITAMIN C PO) Take 1 tablet by mouth daily.     Budeson-Glycopyrrol-Formoterol (BREZTRI AEROSPHERE IN) Inhale 2 puffs into the lungs daily.     diazepam (VALIUM) 2 MG tablet Take 2 mg by mouth daily as needed for anxiety.     Dupilumab (DUPIXENT) 300 MG/2ML SOPN Inject 300 mg into the skin every 14 (fourteen) days. 12 mL 1   EPINEPHrine 0.3 mg/0.3 mL IJ SOAJ injection Inject 0.3 mg into the muscle as needed for anaphylaxis.      famotidine (PEPCID) 20 MG tablet One after supper (Patient taking differently: Take 20 mg by mouth every evening. One after supper) 30 tablet 11   fluticasone (FLONASE) 50 MCG/ACT nasal spray Place 2 sprays into both nostrils daily as needed for allergies.     levothyroxine (SYNTHROID) 75 MCG tablet Take 75 mcg by mouth every morning.     MAGNESIUM PO Take 1 tablet by mouth daily.     NUCYNTA 50 MG tablet Take 50 mg by mouth 2 (two) times daily as needed for moderate pain.      ondansetron (ZOFRAN) 4 MG tablet Take 1 tablet (4 mg total) by mouth  daily as needed for nausea or vomiting. 15 tablet 1   pantoprazole (PROTONIX) 40 MG tablet Take 1 tablet (40 mg total) by mouth daily. Take 30-60 min before first meal of the day 30 tablet 2   VITAMIN D PO Take 1 tablet by mouth daily. 5000 IU     No current facility-administered medications for this visit.    Review of Systems  Constitutional:  Constitutional negative. HENT: HENT negative.  Eyes: Eyes negative.  Cardiovascular: Positive for leg swelling.  GI: Gastrointestinal negative.  Musculoskeletal: Positive for leg pain.  Neurological: Neurological negative. Hematologic: Hematologic/lymphatic negative.  Psychiatric: Psychiatric negative.        Objective:  Objective  Vitals:   05/08/22 1217  BP: 133/85  Pulse: 89  Resp: 20  Temp: 98.2 F (36.8 C)  SpO2: 98%      Physical Exam HENT:     Head: Normocephalic.     Nose: Nose normal.     Mouth/Throat:     Mouth: Mucous membranes are moist.  Eyes:     Pupils: Pupils are equal, round, and reactive to light.  Cardiovascular:     Rate and Rhythm: Normal rate.     Pulses: Normal pulses.  Pulmonary:     Effort: Pulmonary effort is normal.  Abdominal:     General: Abdomen is flat.     Palpations: Abdomen is soft.  Musculoskeletal:     Right lower leg: No edema.     Left lower leg: Edema present.     Comments: RLE 42 cm/ LLE 46cm  Neurological:     Mental Status: She is alert.     Data: Villalta Score for Post-Thrombotic Syndrome: Pain: Moderate Cramps: Absent Heaviness: Absent Paresthesia: Absent Pruritus: Absent Pretibial Edema: Mild Skin Induration: Absent Hyperpigmentation: Absent Redness: Absent Venous Ectasia: Absent Pain on calf compression: Absent Villalta Preliminary Score: 3 Is venous ulcer present?: No If venous ulcer is present and score is <15, then 15 points total are assigned: Absent Villalta Total Score: 3   LEFT     CompressibilityPhasicitySpontaneityPropertiesThrombus   Aging    +---------+---------------+---------+-----------+----------+--------------+   CFV     Full           Yes      Yes                                   +---------+---------------+---------+-----------+----------+--------------+  SFJ     Full                                                          +---------+---------------+---------+-----------+----------+--------------+   FV Prox  Partial                                      Chronic          +---------+---------------+---------+-----------+----------+--------------+   FV Mid   Partial                                      Chronic          +---------+---------------+---------+-----------+----------+--------------+   FV DistalPartial                                      Chronic          +---------+---------------+---------+-----------+----------+--------------+   PFV     Full                                                          +---------+---------------+---------+-----------+----------+--------------+   POP     Partial        Yes      Yes                  Chronic          +---------+---------------+---------+-----------+----------+--------------+   PTV     Full                                                          +---------+---------------+---------+-----------+----------+--------------+   PERO    Full                                                          +---------+---------------+---------+-----------+----------+--------------+               Summary:  RIGHT:  - No evidence of common femoral vein obstruction.    LEFT:  - Findings consistent with chronic deep vein thrombosis involving the left  femoral vein, and left popliteal vein.  - No cystic structure found in the popliteal fossa.         Assessment/Plan:    52 year old female found to have extensive left lower extremity DVT.  She is now presenting with left medial thigh  pain that is consistent with phlebitis and I have recommended warm compresses and ibuprofen and continue her Eliquis which is scheduled for 6 months given her recent PE.  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  For this reason I will see her back in 6 months and we can discuss her symptoms at the time given that her leg is 10% larger on the left and right and her risk of recurrent DVT with future illnesses would remain high.  I have recommended transitioning to 81 mg aspirin when Eliquis is discontinued.  We will fit her for knee-high stockings today.     Maeola Harman MD [facility]

## 2022-05-08 NOTE — Progress Notes (Signed)
DVT Clinic Note  Name: Jean Huber     MRN: [id number]     DOB: 1970-04-26     Sex: female  PCP: Cleatis Polka., MD  Today's Visit: Visit Information: Initial Visit  Referred to DVT Clinic by: Dr. Lucianne Muss [facility] hospitalist)  Referred to CPP by: Dr. Chestine Spore Reason for referral:  Chief Complaint  Patient presents with   Med Management - DVT   HISTORY OF PRESENT ILLNESS:  Jean Huber is a 52 y.o. female with PMH asthma, obesity, COVID 04/2022, who presents after diagnosis of DVT for medication management. Patient presented to the ED 04/28/22 with left thigh pain, swelling, and SOB, found to have occlusive thrombus involving the proximal femoral vein of the left leg and bilateral PE thought to be secondary to recent COVID infection. She was evaluated by Dr. Chestine Spore in the hospital who recommended no intervention given no involvement in the common femoral vein or iliac veins and the patient doing better symptomatically. She was transitioned from heparin to Eliquis with plans for 6 months of anticoagulation and was discharged with follow up in the DVT Clinic.   Today patient reports she was doing well at hospital discharge with pain and swelling having improved, but yesterday she started having worsening pain in her L inner thigh, which is where her pain initially started that made her present to the ED. She says the pain is severe enough that she is planning to go to the ED after today's visit. Denies abnormal bleeding or bruising. Denies missed doses of Eliquis. Reports she needs to purchase new compression stockings. Endorses some residual SOB from PE particularly with exertion.   Positive Thrombotic Risk Factors: Recent COVID diagnosis (within 3 months), Obesity Bleeding Risk Factors: Anticoagulant therapy  Negative Thrombotic Risk Factors: Previous VTE, Recent surgery (within 3 months), Recent trauma (within 3 months), Recent admission to hospital with acute illness (within 3 months),  Central venous catheterization, Paralysis, paresis, or recent plaster cast immobilization of lower extremity, Bed rest >72 hours within 3 months, Sedentary journey lasting >8 hours within 4 weeks, Pregnancy, Testosterone therapy, Estrogen therapy, Recent cesarean section (within 3 months), Within 6 weeks postpartum, Erythropoiesis-stimulating agent, Active cancer, Smoking, Older age, Known thrombophilic condition, Non-malignant, chronic inflammatory condition  Rx Insurance Coverage: Commercial Rx Affordability: Eliquis is $20 for a 30 DS. She has a copay card to reduce cost to $10/month.  Preferred Pharmacy: Refills were sent to [facility] at discharge. She plans to transfer these to her [facility] closer to her home.   Past Medical History:  Diagnosis Date   Allergy    Anemia    past hx    Anxiety    Asthma    Constipation    no meds-    COVID-19 virus infection    05-25-2019   GERD (gastroesophageal reflux disease)    past hx - no meds now    Neuromuscular disorder (HCC)    fibromyalgia    Palpitations    due to Thyroid    Thyroid disease     Past Surgical History:  Procedure Laterality Date   APPENDECTOMY     CARPAL TUNNEL RELEASE Left    CESAREAN SECTION     x 1    COLONOSCOPY     age 89 in [place] due to diarrhea/ constipation    ROTATOR CUFF REPAIR Right    SPINAL FUSION     L4-L5   TONSILLECTOMY      Social History   Socioeconomic  History   Marital status: Married    Spouse name: Not on file   Number of children: Not on file   Years of education: Not on file   Highest education level: Not on file  Occupational History   Not on file  Tobacco Use   Smoking status: Never   Smokeless tobacco: Never  Substance and Sexual Activity   Alcohol use: Yes    Comment: occ only    Drug use: Never   Sexual activity: Not on file  Other Topics Concern   Not on file  Social History Narrative   Not on file   Social Determinants of Health   Financial Resource Strain: Not  on file  Food Insecurity: No Food Insecurity (04/29/2022)   Hunger Vital Sign    Worried About Running Out of Food in the Last Year: Never true    Ran Out of Food in the Last Year: Never true  Transportation Needs: No Transportation Needs (04/29/2022)   PRAPARE - Administrator, Civil Service (Medical): No    Lack of Transportation (Non-Medical): No  Physical Activity: Not on file  Stress: Not on file  Social Connections: Not on file  Intimate Partner Violence: Not At Risk (04/29/2022)   Humiliation, Afraid, Rape, and Kick questionnaire    Fear of Current or Ex-Partner: No    Emotionally Abused: No    Physically Abused: No    Sexually Abused: No    Family History  Problem Relation Age of Onset   Breast cancer Maternal Aunt    Colon cancer Paternal Aunt    Kidney cancer Paternal Grandmother    Bladder Cancer Paternal Grandmother    Pancreatic cancer Paternal Grandfather    Colon cancer Cousin    Colon polyps Neg Hx    Esophageal cancer Neg Hx    Rectal cancer Neg Hx    Stomach cancer Neg Hx     Allergies as of 05/08/2022 - Review Complete 05/08/2022  Allergen Reaction Noted   Iodinated casein Anaphylaxis 02/04/2018   Ivp dye [iodinated contrast media] Anaphylaxis 04/08/2019   Rocephin [ceftriaxone] Hives and Nausea And Vomiting 09/09/2019   Shellfish allergy Anaphylaxis 02/17/2018   Erythromycin Hives, Diarrhea, and Nausea And Vomiting 03/22/2013   Diclofenac Hives 04/08/2019    Current Outpatient Medications on File Prior to Encounter  Medication Sig Dispense Refill   albuterol (PROVENTIL) (2.5 MG/3ML) 0.083% nebulizer solution Take 2.5 mg by nebulization every 4 (four) hours as needed for wheezing or shortness of breath.      albuterol (VENTOLIN HFA) 108 (90 Base) MCG/ACT inhaler Inhale 1 puff into the lungs every 6 (six) hours as needed for wheezing or shortness of breath.     apixaban (ELIQUIS) 5 MG TABS tablet Take 2 tablets (10 mg) twice daily for 7 days then  take 1 tablet (5 mg) twice daily for 6 months. (Patient taking differently: Take 5 mg by mouth 2 (two) times daily.) 60 tablet 6   Ascorbic Acid (VITAMIN C PO) Take 1 tablet by mouth daily.     Budeson-Glycopyrrol-Formoterol (BREZTRI AEROSPHERE IN) Inhale 2 puffs into the lungs daily.     diazepam (VALIUM) 2 MG tablet Take 2 mg by mouth daily as needed for anxiety.     Dupilumab (DUPIXENT) 300 MG/2ML SOPN Inject 300 mg into the skin every 14 (fourteen) days. 12 mL 1   famotidine (PEPCID) 20 MG tablet One after supper (Patient taking differently: Take 20 mg by mouth every evening. One  after supper) 30 tablet 11   fluticasone (FLONASE) 50 MCG/ACT nasal spray Place 2 sprays into both nostrils daily as needed for allergies.     levothyroxine (SYNTHROID) 75 MCG tablet Take 75 mcg by mouth every morning.     MAGNESIUM PO Take 1 tablet by mouth daily.     NUCYNTA 50 MG tablet Take 50 mg by mouth 2 (two) times daily as needed for moderate pain.      ondansetron (ZOFRAN) 4 MG tablet Take 1 tablet (4 mg total) by mouth daily as needed for nausea or vomiting. 15 tablet 1   pantoprazole (PROTONIX) 40 MG tablet Take 1 tablet (40 mg total) by mouth daily. Take 30-60 min before first meal of the day 30 tablet 2   VITAMIN D PO Take 1 tablet by mouth daily. 5000 IU     EPINEPHrine 0.3 mg/0.3 mL IJ SOAJ injection Inject 0.3 mg into the muscle as needed for anaphylaxis.      No current facility-administered medications on file prior to encounter.   REVIEW OF SYSTEMS:  Review of Systems  Respiratory:  Positive for shortness of breath.   Cardiovascular:  Positive for leg swelling. Negative for chest pain and palpitations.  Musculoskeletal:        Pain in L medial thigh  Neurological:  Negative for dizziness and tingling.   PHYSICAL EXAMINATION:  Vitals:   05/08/22 0913  BP: 126/88  Pulse: 88  SpO2: 99%   Physical Exam Vitals reviewed.  Cardiovascular:     Rate and Rhythm: Normal rate.  Pulmonary:      Effort: Pulmonary effort is normal.  Musculoskeletal:     Left lower leg: Edema (nonpitting edema) present.  Psychiatric:        Mood and Affect: Mood normal.        Behavior: Behavior normal.        Thought Content: Thought content normal.   Villalta Score for Post-Thrombotic Syndrome: Pain: Moderate Cramps: Absent Heaviness: Absent Paresthesia: Absent Pruritus: Absent Pretibial Edema: Mild Skin Induration: Absent Hyperpigmentation: Absent Redness: Absent Venous Ectasia: Absent Pain on calf compression: Absent Villalta Preliminary Score: 3 Is venous ulcer present?: No If venous ulcer is present and score is <15, then 15 points total are assigned: Absent Villalta Total Score: 3  LABS:  CBC     Component Value Date/Time   WBC 9.6 04/30/2022 0315   RBC 4.50 04/30/2022 0315   HGB 12.9 04/30/2022 0315   HCT 37.9 04/30/2022 0315   PLT 239 04/30/2022 0315   MCV 84.2 04/30/2022 0315   MCH 28.7 04/30/2022 0315   MCHC 34.0 04/30/2022 0315   RDW 14.5 04/30/2022 0315   LYMPHSABS 3.1 10/24/2020 2255   MONOABS 0.6 10/24/2020 2255   EOSABS 283 10/31/2021 0957   BASOSABS 0.1 10/24/2020 2255    Hepatic Function      Component Value Date/Time   PROT 7.6 10/24/2020 2255   ALBUMIN 4.2 10/24/2020 2255   AST 23 10/24/2020 2255   ALT 10 10/24/2020 2255   ALKPHOS 95 10/24/2020 2255   BILITOT 0.6 10/24/2020 2255    Renal Function   Lab Results  Component Value Date   CREATININE 0.70 04/29/2022   CREATININE 0.75 04/28/2022   CREATININE 0.73 10/24/2020    Estimated Creatinine Clearance: 95.4 mL/min (by C-G formula based on SCr of 0.7 mg/dL).   [facility] Vascular Lab Studies:  04/28/22 US Venous Img Lower Left (DVT Study) IMPRESSION: DVT extends the entire length of the left femoral  and popliteal veins. The calf veins were not well assessed.  04/29/22 NM Pulmonary Perfusion FINDINGS: There is a large segmental perfusion defect identified involving the right upper lobe. There is  also a large segmental perfusion defect involving the anterior basal left upper lobe.  IMPRESSION: Exam positive for large segmental perfusion defects compatible with acute pulmonary embolus.  ASSESSMENT: Location of DVT: Left femoral vein, Left popliteal vein Cause of DVT: provoked by a transient risk factor  Given new/worsening pain in L thigh, consulted with Dr. Lenell Antu who recommended a repeat study and same day appointment at [facility] for further evaluation, which I have arranged.   PLAN: -Continue apixaban (Eliquis) 5 mg twice daily. -Expected duration of therapy: 6 months. Therapy started on 04/28/22. -Patient educated on purpose, proper use and potential adverse effects of apixaban (Eliquis). -Discussed importance of taking medication around the same time every day. -Advised patient of medications to avoid (NSAIDs, aspirin doses >100 mg daily). -Educated that Tylenol (acetaminophen) is the preferred analgesic to lower the risk of bleeding. -Advised patient to alert all providers of anticoagulation therapy prior to starting a new medication or having a procedure. -Emphasized importance of monitoring for signs and symptoms of bleeding (abnormal bruising, prolonged bleeding, nose bleeds, bleeding from gums, discolored urine, black tarry stools). -Educated patient to present to the ED if emergent signs and symptoms of new thrombosis occur. -Counseled patient to wear compression stockings daily, removing at night.  Follow up: Repeat US today at [facility] then follow up this afternoon with Dr. Randie Heinz at [facility].   Pervis Hocking, PharmD, [facility], CPP Deep Vein Thrombosis Clinic Clinical Pharmacist Practitioner Office: [phone]

## 2022-05-08 NOTE — Progress Notes (Signed)
Lower extremity venous left study completed.   Please see CV Proc for preliminary results.   Rasheen Bells, RDMS, RVT  

## 2022-05-09 ENCOUNTER — Ambulatory Visit: Payer: Commercial Managed Care - PPO | Admitting: Student

## 2022-05-14 ENCOUNTER — Other Ambulatory Visit: Payer: Self-pay

## 2022-05-14 DIAGNOSIS — I87002 Postthrombotic syndrome without complications of left lower extremity: Secondary | ICD-10-CM

## 2022-05-14 DIAGNOSIS — I82512 Chronic embolism and thrombosis of left femoral vein: Secondary | ICD-10-CM

## 2022-05-20 ENCOUNTER — Other Ambulatory Visit: Payer: Self-pay | Admitting: Pulmonary Disease

## 2022-05-20 DIAGNOSIS — D7219 Other eosinophilia: Secondary | ICD-10-CM

## 2022-05-20 DIAGNOSIS — J4540 Moderate persistent asthma, uncomplicated: Secondary | ICD-10-CM

## 2022-05-20 NOTE — Telephone Encounter (Signed)
Refill sent for [place] to [facility]: [phone]  Dose: 300 mg SQ every 2 weeks  Last OV: 02/12/2022 Provider: Dr. Valeta Harms  Next OV: not yet scheduled but due in April 2024  Knox Saliva, PharmD, MPH, BCPS Clinical Pharmacist (Rheumatology and Pulmonology)

## 2022-05-23 ENCOUNTER — Telehealth: Payer: Self-pay

## 2022-05-23 NOTE — Telephone Encounter (Signed)
-----   Message from Waynetta Sandy, MD sent at 05/23/2022 11:14 AM EST ----- Regarding: RE: Advice I can see her with IVC left iliac vein duplex.  bc ----- Message ----- From: Gevena Mart, RN Sent: 05/23/2022  11:11 AM EST To: Waynetta Sandy, MD Subject: Advice                                         Dr. Donzetta Matters, This pt called in stating that she's doing everything that you asked her to do, but the pain in her LLE from the DVT is becoming difficult to deal with. It's keeping her up at night. I know we don't manage DVT's per se and you had mentioned phlebitis. Is there anything else you would recommend? Should she see her PCP or be seen sooner by you than the original plan of 6 months? Thanks, Glenard Haring, RN - Triage

## 2022-05-23 NOTE — Telephone Encounter (Signed)
Pt called stating that the pain in her LLE, where the DVT is, is becoming difficult to deal with. The pain is keeping her up at HS. She has been doing everything that Dr. Donzetta Matters recommended and is seeking advice.  Staff msg sent to Dr. Donzetta Matters.  Called pt, two identifiers used. Relayed information regarding moving the appts earlier. Appts scheduled for next available and fasting lab instructions reviewed with pt. Confirmed understanding.

## 2022-05-25 ENCOUNTER — Other Ambulatory Visit (HOSPITAL_COMMUNITY): Payer: Self-pay

## 2022-05-29 ENCOUNTER — Encounter: Payer: Self-pay | Admitting: Vascular Surgery

## 2022-05-29 ENCOUNTER — Other Ambulatory Visit: Payer: Self-pay | Admitting: *Deleted

## 2022-05-29 ENCOUNTER — Ambulatory Visit: Payer: Commercial Managed Care - PPO | Admitting: Vascular Surgery

## 2022-05-29 ENCOUNTER — Ambulatory Visit (HOSPITAL_COMMUNITY)
Admission: RE | Admit: 2022-05-29 | Discharge: 2022-05-29 | Disposition: A | Discharge status: Home / Self Care | Payer: Commercial Managed Care - PPO | Source: Ambulatory Visit | Attending: Vascular Surgery | Admitting: Vascular Surgery

## 2022-05-29 ENCOUNTER — Encounter: Payer: Self-pay | Admitting: *Deleted

## 2022-05-29 VITALS — BP 134/97 | HR 83 | Temp 98.2°F | Resp 20 | Ht 65.0 in | Wt 222.0 lb

## 2022-05-29 DIAGNOSIS — I87002 Postthrombotic syndrome without complications of left lower extremity: Secondary | ICD-10-CM

## 2022-05-29 DIAGNOSIS — I82512 Chronic embolism and thrombosis of left femoral vein: Secondary | ICD-10-CM

## 2022-05-29 NOTE — Progress Notes (Signed)
[name], female   DOB: 08-Jun-1970, 52 y.o.   MRN: [id number]  Reason for Consult: Follow-up   Referred by Cleatis Polka., MD  Subjective:     HPI:  Jean Huber is a 52 y.o. female with recent diagnosis of left lower extremity DVT with a family history of DVT in her grandmother.  She had a PE with her DVT which at the time she also had COVID and stated that her leg was very large.  She also states that her leg going back over 20 years on the left side has been greater in size on the right side.  Patient now maintained on Eliquis and is not having any bleeding issues from this.  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  Patient now states that she is having significant pain at all times on the left medial thigh at the site of her previous DVT and that her swelling although improved from the original DVT has persisted and is at least moderately uncomfortable at all times the day.  She has been compliant with compression stockings.  Past Medical History:  Diagnosis Date   Allergy    Anemia    past hx    Anxiety    Asthma    Constipation    no meds-    COVID-19 virus infection    05-25-2019   DVT (deep venous thrombosis) (HCC)    GERD (gastroesophageal reflux disease)    past hx - no meds now    Neuromuscular disorder (HCC)    fibromyalgia    Palpitations    due to Thyroid    Thyroid disease    Family History  Problem Relation Age of Onset   Breast cancer Maternal Aunt    Colon cancer Paternal Aunt    Kidney cancer Paternal Grandmother    Bladder Cancer Paternal Grandmother    Pancreatic cancer Paternal Grandfather    Colon cancer Cousin    Colon polyps Neg Hx    Esophageal cancer Neg Hx    Rectal cancer Neg Hx    Stomach cancer Neg Hx    Past Surgical History:  Procedure Laterality Date   APPENDECTOMY     CARPAL TUNNEL RELEASE  Left    CESAREAN SECTION     x 1    COLONOSCOPY     age 48 in [place] due to diarrhea/ constipation    ROTATOR CUFF REPAIR Right    SPINAL FUSION     L4-L5   TONSILLECTOMY      Short Social History:  Social History   Tobacco Use   Smoking status: Never   Smokeless tobacco: Never  Substance Use Topics   Alcohol use: Yes    Comment: occ only     Allergies  Allergen Reactions   Iodinated Casein Anaphylaxis   Ivp Dye [Iodinated Contrast Media] Anaphylaxis   Rocephin [Ceftriaxone] Hives and Nausea And Vomiting   Shellfish Allergy Anaphylaxis   Erythromycin Hives, Diarrhea and Nausea And Vomiting   Diclofenac Hives    Current Outpatient Medications  Medication Sig Dispense Refill   albuterol (PROVENTIL) (2.5 MG/3ML) 0.083% nebulizer solution Take 2.5 mg by nebulization every 4 (four) hours as needed for wheezing or shortness of breath.      albuterol (VENTOLIN HFA) 108 (90 Base) MCG/ACT inhaler Inhale 1 puff into the lungs every 6 (six)  hours as needed for wheezing or shortness of breath.     apixaban (ELIQUIS) 5 MG TABS tablet Take 2 tablets (10 mg) twice daily for 7 days then take 1 tablet (5 mg) twice daily for 6 months. (Patient taking differently: Take 5 mg by mouth 2 (two) times daily.) 60 tablet 6   Ascorbic Acid (VITAMIN C PO) Take 1 tablet by mouth daily.     Budeson-Glycopyrrol-Formoterol (BREZTRI AEROSPHERE IN) Inhale 2 puffs into the lungs daily.     cyclobenzaprine (FLEXERIL) 10 MG tablet Take 10 mg by mouth 3 (three) times daily as needed.     diazepam (VALIUM) 2 MG tablet Take 2 mg by mouth daily as needed for anxiety.     Dupilumab (DUPIXENT) 300 MG/2ML SOPN INJECT 300 MG UNDER THE SKIN EVERY 14 DAYS 4 mL 3   EPINEPHrine 0.3 mg/0.3 mL IJ SOAJ injection Inject 0.3 mg into the muscle as needed for anaphylaxis.      famotidine (PEPCID) 20 MG tablet One after supper (Patient taking differently: Take 20 mg by mouth every evening. One after supper) 30 tablet 11    fluticasone (FLONASE) 50 MCG/ACT nasal spray Place 2 sprays into both nostrils daily as needed for allergies.     levothyroxine (SYNTHROID) 75 MCG tablet Take 75 mcg by mouth every morning.     MAGNESIUM PO Take 1 tablet by mouth daily.     NUCYNTA 50 MG tablet Take 50 mg by mouth 2 (two) times daily as needed for moderate pain.      ondansetron (ZOFRAN) 4 MG tablet Take 1 tablet (4 mg total) by mouth daily as needed for nausea or vomiting. 15 tablet 1   pantoprazole (PROTONIX) 40 MG tablet Take 1 tablet (40 mg total) by mouth daily. Take 30-60 min before first meal of the day 30 tablet 2   VITAMIN D PO Take 1 tablet by mouth daily. 5000 IU     No current facility-administered medications for this visit.    Review of Systems  Constitutional:  Constitutional negative. HENT: HENT negative.  Eyes: Eyes negative.  Respiratory: Respiratory negative.  Cardiovascular: Positive for leg swelling.  GI: Gastrointestinal negative.  Musculoskeletal: Positive for leg pain.  Skin: Skin negative.  Neurological: Neurological negative. Hematologic: Hematologic/lymphatic negative.  Psychiatric: Psychiatric negative.        Objective:  Objective   Vitals:   05/29/22 0840  BP: (!) 134/97  Pulse: 83  Resp: 20  Temp: 98.2 F (36.8 C)  SpO2: 98%  Weight: 222 lb (100.7 kg)  Height: 5\' 5"  (1.651 m)   Body mass index is 36.94 kg/m.  Physical Exam HENT:     Head: Normocephalic.     Nose: Nose normal.  Eyes:     Pupils: Pupils are equal, round, and reactive to light.  Cardiovascular:     Rate and Rhythm: Normal rate.     Pulses: Normal pulses.  Pulmonary:     Effort: Pulmonary effort is normal.  Abdominal:     General: Abdomen is flat.     Palpations: Abdomen is soft.  Musculoskeletal:     Cervical back: Normal range of motion and neck supple.     Right lower leg: No edema.     Left lower leg: Edema present.     Comments: Right leg measures 42 cm left leg 47 cm (measured 10 cm from  tibial tuberosity)  Skin:    General: Skin is warm.     Capillary Refill: Capillary refill  takes less than 2 seconds.  Neurological:     General: No focal deficit present.     Mental Status: She is alert.  Psychiatric:        Mood and Affect: Mood normal.        Thought Content: Thought content normal.        Judgment: Judgment normal.     Data: IVC/Iliac Findings:  +----------+------+--------+--------+    IVC    PatentThrombusComments  +----------+------+--------+--------+  IVC Prox  patent                  +----------+------+--------+--------+  IVC Mid   patent                  +----------+------+--------+--------+  IVC Distalpatent                  +----------+------+--------+--------+     +-------------------+---------+-----------+---------+-----------+--------+         CIV        RT-PatentRT-ThrombusLT-PatentLT-ThrombusComments  +-------------------+---------+-----------+---------+-----------+--------+  Common Iliac Prox   patent              patent                       +-------------------+---------+-----------+---------+-----------+--------+  Common Iliac Mid                        patent                       +-------------------+---------+-----------+---------+-----------+--------+  Common Iliac Distal                     patent                       +-------------------+---------+-----------+---------+-----------+--------+      +-------------------------+---------+-----------+---------+-----------+----  ----+            EIV            RT-PatentRT-ThrombusLT-PatentLT-ThrombusComments  +-------------------------+---------+-----------+---------+-----------+----  ----+  External Iliac Vein Prox                      patent                        +-------------------------+---------+-----------+---------+-----------+----  ----+  External Iliac Vein Mid                       patent                         +-------------------------+---------+-----------+---------+-----------+----  ----+  External Iliac Vein                           patent                        Distal                                                                      +-------------------------+---------+-----------+---------+-----------+----  ----+        Summary:  IVC/Iliac: No evidence of thrombus  in IVC and Iliac veins.       Assessment/Plan:    52 year old female now with post thrombotic syndrome left lower extremity with 10 % increase in size of the left lower extremity relative to the right.  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  She is on anticoagulation and can take the dose the night before and will hold the morning dose when the procedure is scheduled.  She is planning to take a beach trip and we can schedule when she returns.     Maeola Harman MD [facility]

## 2022-06-06 ENCOUNTER — Telehealth: Payer: Self-pay

## 2022-06-06 NOTE — Telephone Encounter (Signed)
Pt called to let us know her pain and swelling has greatly improved this week. She has been walking a lot more at the beach and more active and wants to cancel her procedure scheduled for next week. MD is in agreement and has advised pt to f/u in 6 months w/ LLE reflux study. She is aware she can cancel this if she is still feeling good at this time, per MD. She will call us if she has any questions/concerns in the meantime.

## 2022-06-10 ENCOUNTER — Ambulatory Visit (HOSPITAL_COMMUNITY)
Admission: RE | Admit: 2022-06-10 | Payer: Commercial Managed Care - PPO | Source: Home / Self Care | Admitting: Vascular Surgery

## 2022-06-10 ENCOUNTER — Encounter (HOSPITAL_COMMUNITY): Admission: RE | Payer: Self-pay | Source: Home / Self Care

## 2022-06-10 SURGERY — LOWER EXTREMITY VENOGRAPHY
Anesthesia: LOCAL

## 2022-07-29 ENCOUNTER — Encounter: Payer: Self-pay | Admitting: Pulmonary Disease

## 2022-07-29 ENCOUNTER — Ambulatory Visit: Payer: Commercial Managed Care - PPO | Admitting: Pulmonary Disease

## 2022-07-29 VITALS — BP 110/80 | HR 91 | Ht 65.0 in | Wt 225.2 lb

## 2022-07-29 DIAGNOSIS — J4541 Moderate persistent asthma with (acute) exacerbation: Secondary | ICD-10-CM

## 2022-07-29 DIAGNOSIS — I8290 Acute embolism and thrombosis of unspecified vein: Secondary | ICD-10-CM | POA: Diagnosis not present

## 2022-07-29 DIAGNOSIS — D7219 Other eosinophilia: Secondary | ICD-10-CM

## 2022-07-29 DIAGNOSIS — I871 Compression of vein: Secondary | ICD-10-CM

## 2022-07-29 NOTE — Patient Instructions (Addendum)
Thank you for visiting Dr. Tonia Brooms at [facility]. Today we recommend the following:  Continue eliquis Continue breztri   Return in about 6 months (around 01/28/2023) for with APP.    Please do your part to reduce the spread of COVID-19.

## 2022-07-29 NOTE — Progress Notes (Signed)
Synopsis: Referred in April 2023 for asthma by Cleatis Polka., MD  Subjective:   [name] GENDER: female DOB: 23-Sep-1970, MRN: [id number]  Chief Complaint  Patient presents with   Follow-up    F/up on asthma.    52 year old female, past medical history of anxiety, asthma, COVID-19 in February 2021, GERD, No family history of lung disease or lung cancer. Moved to [place] during covid. Had a pulmonologist in [place]. Over the past month or so she has had increased episodes of bronchitis and increased albuterol inhaler use. She also has a nebulizer. Asthma dx in 2005.has been on Advair, Symbicort. Has been on ICS on spring and fall. Usually less and once per month with albuterol. Spring time she uses her nebs 2 time per week.  From respiratory standpoint she is doing okay.  She just recovered from an exacerbation.  She has had approximately 6-8 exacerbations this past year.  OV 01/03/2022: Here today for follow-up.  Her asthma has been getting worse.  She recently just had PFTs completed prior to today's office visit.  Her FEV1 FVC ratio is normal.  She has normal spirometry, evidence of air trapping with an RV of 122%, DLCO of 95%.  Patient states that she still having ongoing cough.  She is still using her inhaler regimen Breztri twice a day.  She is planning to start her Dupixent injections which she is actually on get her first injection today.  This was started after being seen by SG, NP in the office a few weeks ago.  OV 07/29/2022: she stopped her dupixent. She felt like she was constantly getting sick.   She also got COVID.  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  She was started on Eliquis.  She had a venogram that was concern for May Thurner syndrome.  Has follow-up with vascular surgery.  They are considering placing a stent in the future.  Currently on Eliquis twice daily.  From her asthma symptoms she is doing well with no issues currently on  Breztri twice daily.    Past Medical History:  Diagnosis Date   Allergy    Anemia    past hx    Anxiety    Asthma    Constipation    no meds-    COVID-19 virus infection    05-25-2019   DVT (deep venous thrombosis) (HCC)    GERD (gastroesophageal reflux disease)    past hx - no meds now    Neuromuscular disorder (HCC)    fibromyalgia    Palpitations    due to Thyroid    Thyroid disease      Family History  Problem Relation Age of Onset   Breast cancer Maternal Aunt    Colon cancer Paternal Aunt    Kidney cancer Paternal Grandmother    Bladder Cancer Paternal Grandmother    Pancreatic cancer Paternal Grandfather    Colon cancer Cousin    Colon polyps Neg Hx    Esophageal cancer Neg Hx    Rectal cancer Neg Hx    Stomach cancer Neg Hx      Past Surgical History:  Procedure Laterality Date   APPENDECTOMY     CARPAL TUNNEL RELEASE Left    CESAREAN SECTION     x 1    COLONOSCOPY     age 2 in [place] due to diarrhea/ constipation    ROTATOR CUFF REPAIR Right    SPINAL FUSION  L4-L5   TONSILLECTOMY      Social History   Socioeconomic History   Marital status: Married    Spouse name: Not on file   Number of children: Not on file   Years of education: Not on file   Highest education level: Not on file  Occupational History   Not on file  Tobacco Use   Smoking status: Never   Smokeless tobacco: Never  Substance and Sexual Activity   Alcohol use: Yes    Comment: occ only    Drug use: Never   Sexual activity: Not on file  Other Topics Concern   Not on file  Social History Narrative   Not on file   Social Determinants of Health   Financial Resource Strain: Not on file  Food Insecurity: No Food Insecurity (04/29/2022)   Hunger Vital Sign    Worried About Running Out of Food in the Last Year: Never true    Ran Out of Food in the Last Year: Never true  Transportation Needs: No Transportation Needs (04/29/2022)   PRAPARE - Doctor, general practice (Medical): No    Lack of Transportation (Non-Medical): No  Physical Activity: Not on file  Stress: Not on file  Social Connections: Not on file  Intimate Partner Violence: Not At Risk (04/29/2022)   Humiliation, Afraid, Rape, and Kick questionnaire    Fear of Current or Ex-Partner: No    Emotionally Abused: No    Physically Abused: No    Sexually Abused: No     Allergies  Allergen Reactions   Iodinated Casein Anaphylaxis   Ivp Dye [Iodinated Contrast Media] Anaphylaxis   Rocephin [Ceftriaxone] Hives and Nausea And Vomiting   Shellfish Allergy Anaphylaxis   Erythromycin Hives, Diarrhea and Nausea And Vomiting   Diclofenac Hives     Outpatient Medications Prior to Visit  Medication Sig Dispense Refill   albuterol (PROVENTIL) (2.5 MG/3ML) 0.083% nebulizer solution Take 2.5 mg by nebulization every 4 (four) hours as needed for wheezing or shortness of breath.      albuterol (VENTOLIN HFA) 108 (90 Base) MCG/ACT inhaler Inhale 1 puff into the lungs every 6 (six) hours as needed for wheezing or shortness of breath.     apixaban (ELIQUIS) 5 MG TABS tablet Take 2 tablets (10 mg) twice daily for 7 days then take 1 tablet (5 mg) twice daily for 6 months. (Patient taking differently: Take 5 mg by mouth 2 (two) times daily.) 60 tablet 6   Ascorbic Acid (VITAMIN C PO) Take 1 tablet by mouth daily.     Budeson-Glycopyrrol-Formoterol (BREZTRI AEROSPHERE) 160-9-4.8 MCG/ACT AERO Inhale 2 each into the lungs 2 times daily at 12 noon and 4 pm.     Cholecalciferol (VITAMIN D3) 125 MCG (5000 UT) capsule Take 5,000 Units by mouth daily.     diazepam (VALIUM) 2 MG tablet Take 2 mg by mouth daily as needed for anxiety.     EPINEPHrine 0.3 mg/0.3 mL IJ SOAJ injection Inject 0.3 mg into the muscle as needed for anaphylaxis.      fluticasone (FLONASE) 50 MCG/ACT nasal spray Place 2 sprays into both nostrils daily as needed for allergies.     levothyroxine (SYNTHROID) 75 MCG tablet Take 75 mcg  by mouth daily before breakfast.     NUCYNTA 50 MG tablet Take 50 mg by mouth 2 (two) times daily as needed for moderate pain.      Dupilumab (DUPIXENT) 300 MG/2ML SOPN INJECT 300 MG UNDER  THE SKIN EVERY 14 DAYS (Patient not taking: Reported on 07/29/2022) 4 mL 3   ondansetron (ZOFRAN) 4 MG tablet Take 1 tablet (4 mg total) by mouth daily as needed for nausea or vomiting. (Patient not taking: Reported on 06/05/2022) 15 tablet 1   No facility-administered medications prior to visit.    Review of Systems  Constitutional:  Negative for chills, fever, malaise/fatigue and weight loss.  HENT:  Negative for hearing loss, sore throat and tinnitus.   Eyes:  Negative for blurred vision and double vision.  Respiratory:  Negative for cough, hemoptysis, sputum production, shortness of breath, wheezing and stridor.   Cardiovascular:  Positive for leg swelling. Negative for chest pain, palpitations, orthopnea and PND.  Gastrointestinal:  Negative for abdominal pain, constipation, diarrhea, heartburn, nausea and vomiting.  Genitourinary:  Negative for dysuria, hematuria and urgency.  Musculoskeletal:  Negative for joint pain and myalgias.  Skin:  Negative for itching and rash.  Neurological:  Negative for dizziness, tingling, weakness and headaches.  Endo/Heme/Allergies:  Negative for environmental allergies. Does not bruise/bleed easily.  Psychiatric/Behavioral:  Negative for depression. The patient is not nervous/anxious and does not have insomnia.   All other systems reviewed and are negative.    Objective:  Physical Exam Vitals reviewed.  Constitutional:      General: She is not in acute distress.    Appearance: She is well-developed. She is obese.  HENT:     Head: Normocephalic and atraumatic.  Eyes:     General: No scleral icterus.    Conjunctiva/sclera: Conjunctivae normal.     Pupils: Pupils are equal, round, and reactive to light.  Neck:     Vascular: No JVD.     Trachea: No tracheal  deviation.  Cardiovascular:     Rate and Rhythm: Normal rate and regular rhythm.     Heart sounds: Normal heart sounds. No murmur heard. Pulmonary:     Effort: Pulmonary effort is normal. No tachypnea, accessory muscle usage or respiratory distress.     Breath sounds: Normal breath sounds. No stridor. No wheezing, rhonchi or rales.  Abdominal:     General: Bowel sounds are normal. There is no distension.     Palpations: Abdomen is soft.     Tenderness: There is no abdominal tenderness.  Musculoskeletal:        General: No tenderness.     Cervical back: Neck supple.     Right lower leg: Edema present.     Left lower leg: Edema present.  Lymphadenopathy:     Cervical: No cervical adenopathy.  Skin:    General: Skin is warm and dry.     Capillary Refill: Capillary refill takes less than 2 seconds.     Findings: No rash.  Neurological:     Mental Status: She is alert and oriented to person, place, and time.  Psychiatric:        Behavior: Behavior normal.      Vitals:   07/29/22 1427  BP: 110/80  Pulse: 91  SpO2: 95%  Weight: 225 lb 3.2 oz (102.2 kg)  Height: 5\' 5"  (1.651 m)   95% on RA BMI Readings from Last 3 Encounters:  07/29/22 37.48 kg/m  05/29/22 36.94 kg/m  05/08/22 36.61 kg/m   Wt Readings from Last 3 Encounters:  07/29/22 225 lb 3.2 oz (102.2 kg)  05/29/22 222 lb (100.7 kg)  05/08/22 220 lb (99.8 kg)     CBC    Component Value Date/Time   WBC 9.6 04/30/2022  0315   RBC 4.50 04/30/2022 0315   HGB 12.9 04/30/2022 0315   HCT 37.9 04/30/2022 0315   PLT 239 04/30/2022 0315   MCV 84.2 04/30/2022 0315   MCH 28.7 04/30/2022 0315   MCHC 34.0 04/30/2022 0315   RDW 14.5 04/30/2022 0315   LYMPHSABS 3.1 10/24/2020 2255   MONOABS 0.6 10/24/2020 2255   EOSABS 283 10/31/2021 0957   BASOSABS 0.1 10/24/2020 2255     Chest Imaging: No recent chest imaging  Pulmonary Functions Testing Results:    Latest Ref Rng & Units 01/03/2022   12:00 PM  PFT Results   FVC-Pre L 3.11   FVC-Predicted Pre % 89   FVC-Post L 3.27   FVC-Predicted Post % 94   Pre FEV1/FVC % % 78   Post FEV1/FCV % % 80   FEV1-Pre L 2.43   FEV1-Predicted Pre % 88   FEV1-Post L 2.63   DLCO uncorrected ml/min/mmHg 19.86   DLCO UNC% % 95   DLCO corrected ml/min/mmHg 19.86   DLCO COR %Predicted % 95   DLVA Predicted % 93   TLC L 5.46   TLC % Predicted % 107   RV % Predicted % 122     FeNO:   Pathology:   Echocardiogram:   Heart Catheterization:     Assessment & Plan:     ICD-10-CM   1. Moderate persistent asthma with exacerbation  J45.41     2. 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 VTE (venous thromboembolism)  I82.90     4. May-Thurner syndrome  I87.1       Discussion:  This is a 52 year old morbidly obese female seen for recurrent asthma symptoms.  Had severe persistent asthma symptoms in the past was placed on Dupixent.  She decided to come off of the  Picks and after her constantly feeling like she  He was still sick and ultimate  He was diagnosed with a DVT in the lower EXTR  Remedy and bilateral PE he.  This was post  COVID.  She feels like she is breathing  Fine now just using Breo history.  Plan: Continue Breztri, continue as needed albuterol If she has been breakthrough symptoms in the future  Would consider start tespire Return to clinic wIn 6 months with   APP.   Current Outpatient Medications:    albuterol (PROVENTIL) (2.5 MG/3ML) 0.083% nebulizer solution, Take 2.5 mg by nebulization every 4 (four) hours as needed for wheezing or shortness of breath. , Disp: , Rfl:    albuterol (VENTOLIN HFA) 108 (90 Base) MCG/ACT inhaler, Inhale 1 puff into the lungs every 6 (six) hours as needed for wheezing or shortness of breath., Disp: , Rfl:    apixaban (ELIQUIS) 5 MG TABS tablet, Take 2 tablets (10 mg) twice daily for 7 days then take 1 tablet (5 mg) twice daily for 6 months. (Patient taking differently: Take 5 mg by mouth 2 (two) times daily.), Disp:  60 tablet, Rfl: 6   Ascorbic Acid (VITAMIN C PO), Take 1 tablet by mouth daily., Disp: , Rfl:    Budeson-Glycopyrrol-Formoterol (BREZTRI AEROSPHERE) 160-9-4.8 MCG/ACT AERO, Inhale 2 each into the lungs 2 times daily at 12 noon and 4 pm., Disp: , Rfl:    Cholecalciferol (VITAMIN D3) 125 MCG (5000 UT) capsule, Take 5,000 Units by mouth daily., Disp: , Rfl:    diazepam (VALIUM) 2 MG tablet, Take 2 mg by mouth daily as needed for anxiety., Disp: , Rfl:  EPINEPHrine 0.3 mg/0.3 mL IJ SOAJ injection, Inject 0.3 mg into the muscle as needed for anaphylaxis. , Disp: , Rfl:    fluticasone (FLONASE) 50 MCG/ACT nasal spray, Place 2 sprays into both nostrils daily as needed for allergies., Disp: , Rfl:    levothyroxine (SYNTHROID) 75 MCG tablet, Take 75 mcg by mouth daily before breakfast., Disp: , Rfl:    NUCYNTA 50 MG tablet, Take 50 mg by mouth 2 (two) times daily as needed for moderate pain. , Disp: , Rfl:    Dupilumab (DUPIXENT) 300 MG/2ML SOPN, INJECT 300 MG UNDER THE SKIN EVERY 14 DAYS (Patient not taking: Reported on 07/29/2022), Disp: 4 mL, Rfl: 3   ondansetron (ZOFRAN) 4 MG tablet, Take 1 tablet (4 mg total) by mouth daily as needed for nausea or vomiting. (Patient not taking: Reported on 06/05/2022), Disp: 15 tablet, Rfl: 1   Josephine Igo, DO [facility] 07/29/2022 3:00 PM

## 2022-08-22 DIAGNOSIS — M7989 Other specified soft tissue disorders: Secondary | ICD-10-CM

## 2022-11-07 ENCOUNTER — Emergency Department (HOSPITAL_COMMUNITY): Payer: Commercial Managed Care - PPO

## 2022-11-07 ENCOUNTER — Emergency Department (HOSPITAL_COMMUNITY)
Admission: EM | Admit: 2022-11-07 | Discharge: 2022-11-07 | Disposition: A | Discharge status: Home / Self Care | Payer: Commercial Managed Care - PPO | Attending: Emergency Medicine | Admitting: Emergency Medicine

## 2022-11-07 ENCOUNTER — Encounter (HOSPITAL_COMMUNITY): Payer: Self-pay

## 2022-11-07 ENCOUNTER — Other Ambulatory Visit: Payer: Self-pay

## 2022-11-07 DIAGNOSIS — S3991XA Unspecified injury of abdomen, initial encounter: Secondary | ICD-10-CM | POA: Diagnosis present

## 2022-11-07 DIAGNOSIS — J45909 Unspecified asthma, uncomplicated: Secondary | ICD-10-CM | POA: Diagnosis not present

## 2022-11-07 DIAGNOSIS — W11XXXA Fall on and from ladder, initial encounter: Secondary | ICD-10-CM | POA: Diagnosis not present

## 2022-11-07 DIAGNOSIS — S301XXA Contusion of abdominal wall, initial encounter: Secondary | ICD-10-CM | POA: Diagnosis not present

## 2022-11-07 DIAGNOSIS — Z7901 Long term (current) use of anticoagulants: Secondary | ICD-10-CM | POA: Diagnosis not present

## 2022-11-07 DIAGNOSIS — S93401A Sprain of unspecified ligament of right ankle, initial encounter: Secondary | ICD-10-CM | POA: Insufficient documentation

## 2022-11-07 LAB — CBC WITH DIFFERENTIAL/PLATELET
Abs Immature Granulocytes: 0.02 10*3/uL (ref 0.00–0.07)
Basophils Absolute: 0.1 10*3/uL (ref 0.0–0.1)
Basophils Relative: 1 %
Eosinophils Absolute: 0.2 10*3/uL (ref 0.0–0.5)
Eosinophils Relative: 2 %
HCT: 39.1 % (ref 36.0–46.0)
Hemoglobin: 12.6 g/dL (ref 12.0–15.0)
Immature Granulocytes: 0 %
Lymphocytes Relative: 23 %
Lymphs Abs: 2 10*3/uL (ref 0.7–4.0)
MCH: 27 pg (ref 26.0–34.0)
MCHC: 32.2 g/dL (ref 30.0–36.0)
MCV: 83.9 fL (ref 80.0–100.0)
Monocytes Absolute: 0.4 10*3/uL (ref 0.1–1.0)
Monocytes Relative: 5 %
Neutro Abs: 6 10*3/uL (ref 1.7–7.7)
Neutrophils Relative %: 69 %
Platelets: 362 10*3/uL (ref 150–400)
RBC: 4.66 MIL/uL (ref 3.87–5.11)
RDW: 15.2 % (ref 11.5–15.5)
WBC: 8.7 10*3/uL (ref 4.0–10.5)
nRBC: 0 % (ref 0.0–0.2)

## 2022-11-07 LAB — I-STAT CHEM 8, ED
BUN: 15 mg/dL (ref 6–20)
Calcium, Ion: 1.19 mmol/L (ref 1.15–1.40)
Chloride: 104 mmol/L (ref 98–111)
Creatinine, Ser: 0.8 mg/dL (ref 0.44–1.00)
Glucose, Bld: 89 mg/dL (ref 70–99)
HCT: 39 % (ref 36.0–46.0)
Hemoglobin: 13.3 g/dL (ref 12.0–15.0)
Potassium: 3.9 mmol/L (ref 3.5–5.1)
Sodium: 137 mmol/L (ref 135–145)
TCO2: 24 mmol/L (ref 22–32)

## 2022-11-07 NOTE — ED Triage Notes (Signed)
Pt fell off second step of step ladder, states ladder leg went into stomach; also c/o R lower leg and ankle pain; ambulatory to triage, no swelling for deformity noted; c/o RLQ pain; bruising and abrasion noted, tender to palpation; denies hitting head, no loc

## 2022-11-07 NOTE — ED Provider Notes (Signed)
[place] EMERGENCY DEPARTMENT AT [facility] Provider Note   CSN: [id number] Arrival date & time: 11/07/22  1130     History  No chief complaint on file.   Jean Huber is a 52 y.o. female.  The history is provided by the patient and medical records. No language interpreter was used.     52 year old female with history of DVT currently on Eliquis, asthma, thyroid disease, neuromuscular disorder presenting to the ED for evaluation of a fall.  Patient reports today she was in the process of removing her wallpaper and while climbing up a stepladder, she slipped fell to the ground.  States that she struck her knee against the abdominal wall and hurt her right ankle in the process.  She noticed immediate bruising to her lower abdominal wall which concerns her.  States that she is on blood thinner medication and she worries about internal bleeding.  She endorsed mild tenderness to her right ankle but does not think she has any broken bone.  She was able to bear weight and ambulate afterward.  She denies any head or loss of consciousness.  No chest pain or shortness of breath no precipitating symptoms prior to the fall.  Home Medications Prior to Admission medications   Medication Sig Start Date End Date Taking? Authorizing Provider  albuterol (PROVENTIL) (2.5 MG/3ML) 0.083% nebulizer solution Take 2.5 mg by nebulization every 4 (four) hours as needed for wheezing or shortness of breath.  05/13/16   [provider]  albuterol (VENTOLIN HFA) 108 (90 Base) MCG/ACT inhaler Inhale 1 puff into the lungs every 6 (six) hours as needed for wheezing or shortness of breath. 03/10/22   [provider]  apixaban (ELIQUIS) 5 MG TABS tablet Take 2 tablets (10 mg) twice daily for 7 days then take 1 tablet (5 mg) twice daily for 6 months. Patient taking differently: Take 5 mg by mouth 2 (two) times daily. 05/07/22   Willeen Niece, MD  Ascorbic Acid (VITAMIN C PO) Take 1 tablet by  mouth daily.    [provider]  Budeson-Glycopyrrol-Formoterol (BREZTRI AEROSPHERE) 160-9-4.8 MCG/ACT AERO Inhale 2 each into the lungs 2 times daily at 12 noon and 4 pm.    [provider]  Cholecalciferol (VITAMIN D3) 125 MCG (5000 UT) capsule Take 5,000 Units by mouth daily.    [provider]  diazepam (VALIUM) 2 MG tablet Take 2 mg by mouth daily as needed for anxiety.    [provider]  Dupilumab (DUPIXENT) 300 MG/2ML SOPN INJECT 300 MG UNDER THE SKIN EVERY 14 DAYS Patient not taking: Reported on 07/29/2022 05/20/22   Audie Box L, DO  EPINEPHrine 0.3 mg/0.3 mL IJ SOAJ injection Inject 0.3 mg into the muscle as needed for anaphylaxis.     [provider]  fluticasone (FLONASE) 50 MCG/ACT nasal spray Place 2 sprays into both nostrils daily as needed for allergies.    [provider]  levothyroxine (SYNTHROID) 75 MCG tablet Take 75 mcg by mouth daily before breakfast. 11/05/21   [provider]  NUCYNTA 50 MG tablet Take 50 mg by mouth 2 (two) times daily as needed for moderate pain.  11/25/19   [provider]  ondansetron (ZOFRAN) 4 MG tablet Take 1 tablet (4 mg total) by mouth daily as needed for nausea or vomiting. Patient not taking: Reported on 06/05/2022 04/30/22 04/30/23  Willeen Niece, MD      Allergies    Iodinated casein, Ivp dye [iodinated contrast media],  Rocephin [ceftriaxone], Shellfish allergy, Erythromycin, and Diclofenac    Review of Systems   Review of Systems  All other systems reviewed and are negative.   Physical Exam Updated Vital Signs BP (!) 160/100 (BP Location: Left Arm)   Pulse 98   Temp 97.9 F (36.6 C) (Oral)   Resp 18   Ht 5\' 5"  (1.651 m)   Wt 101.6 kg   LMP 07/04/2019 (Approximate) Comment: husband has vascectomy  SpO2 100%   BMI 37.28 kg/m  Physical Exam Vitals and nursing note reviewed.  Constitutional:      General: She is not in acute distress.    Appearance: She is  well-developed. She is obese.  HENT:     Head: Atraumatic.  Eyes:     Conjunctiva/sclera: Conjunctivae normal.  Pulmonary:     Effort: Pulmonary effort is normal.  Abdominal:     Tenderness: There is abdominal tenderness (A moderate size ecchymosis approximately 6 cm in diameter noted to her lower pannus.  With tenderness to palpation.).  Musculoskeletal:        General: Tenderness (Right ankle: Mild tenderness to medial malleoli region with normal ankle range of motion and patient able to bear weight.  Intact dorsalis pedis pulse.) present.     Cervical back: Neck supple.  Skin:    Findings: No rash.  Neurological:     Mental Status: She is alert.  Psychiatric:        Mood and Affect: Mood normal.     ED Results / Procedures / Treatments   Labs (all labs ordered are listed, but only abnormal results are displayed) Labs Reviewed  CBC WITH DIFFERENTIAL/PLATELET  I-STAT CHEM 8, ED    EKG None  Radiology CT ABDOMEN PELVIS WO CONTRAST  Result Date: 11/07/2022 CLINICAL DATA:  Fall. EXAM: CT ABDOMEN AND PELVIS WITHOUT CONTRAST TECHNIQUE: Multidetector CT imaging of the abdomen and pelvis was performed following the standard protocol without IV contrast. RADIATION DOSE REDUCTION: This exam was performed according to the departmental dose-optimization program which includes automated exposure control, adjustment of the mA and/or kV according to patient size and/or use of iterative reconstruction technique. COMPARISON:  October 25, 2020. FINDINGS: Lower chest: No acute abnormality. Hepatobiliary: No focal liver abnormality is seen. No gallstones, gallbladder wall thickening, or biliary dilatation. Pancreas: Unremarkable. No pancreatic ductal dilatation or surrounding inflammatory changes. Spleen: Normal in size without focal abnormality. Adrenals/Urinary Tract: Adrenal glands are unremarkable. Kidneys are normal, without renal calculi, focal lesion, or hydronephrosis. Bladder is unremarkable.  Stomach/Bowel: Stomach is unremarkable. Status post appendectomy. No evidence of bowel obstruction or inflammation. Vascular/Lymphatic: No significant vascular findings are present. No enlarged abdominal or pelvic lymph nodes. Reproductive: Uterus and bilateral adnexa are unremarkable. Other: No ascites is noted. Probable large subcutaneous contusion seen in anterior abdominal wall of right lower quadrant. Musculoskeletal: Status post surgical posterior fusion of L4-5. No acute osseous abnormality is noted. IMPRESSION: Probable large subcutaneous contusion seen in anterior abdominal wall of right lower quadrant. No other acute abnormality seen in the abdomen or pelvis. Electronically Signed   By: Lupita Raider M.D.   On: 11/07/2022 12:47    Procedures Procedures    Medications Ordered in ED Medications - No data to display  ED Course/ Medical Decision Making/ A&P                                 Medical Decision Making Amount and/or Complexity  of Data Reviewed Labs: ordered. Radiology: ordered.   BP (!) 160/100 (BP Location: Left Arm)   Pulse 98   Temp 97.9 F (36.6 C) (Oral)   Resp 18   Ht 5\' 5"  (1.651 m)   Wt 101.6 kg   LMP 07/04/2019 (Approximate) Comment: husband has vascectomy  SpO2 100%   BMI 37.28 kg/m   92:80 PM 52 year old female with history of DVT currently on Eliquis, asthma, thyroid disease, neuromuscular disorder presenting to the ED for evaluation of a fall.  Patient reports today she was in the process of removing her wallpaper and while climbing up a stepladder, she slipped fell to the ground.  States that she struck her knee against the abdominal wall and hurt her right ankle in the process.  She noticed immediate bruising to her lower abdominal wall which concerns her.  States that she is on blood thinner medication and she worries about internal bleeding.  She endorsed mild tenderness to her right ankle but does not think she has any broken bone.  She was able to  bear weight and ambulate afterward.  She denies any head or loss of consciousness.  No chest pain or shortness of breath no precipitating symptoms prior to the fall.  On exam, patient has bruising to her lower abdominal wall with tenderness to palpation.  She has some mild tenderness about the right ankle with full range of motion.  No tenderness to her knee or her hip.  Due to concern of internal injury and internal bleeding coupled with being on blood thinner medication, will obtain abdominal pelvis CT scan for further assessment.  Patient is allergic to contrast dye we will perform noncontrast CT scan.  -Labs ordered, independently viewed and interpreted by me.  Labs remarkable for reassuring value -The patient was maintained on a cardiac monitor.  I personally viewed and interpreted the cardiac monitored which showed an underlying rhythm of: NSR -Imaging independently viewed and interpreted by me and I agree with radiologist's interpretation.  Result remarkable for abd/pelvis CT showing large subcutaneous contusion in the anterior abdominal wall of RLQ.  No other acute changes -This patient presents to the ED for concern of fall, this involves an extensive number of treatment options, and is a complaint that carries with it a high risk of complications and morbidity.  The differential diagnosis includes fx, dislocation, strain, sprain, contusion, internal bleed -Co morbidities that complicate the patient evaluation includes on blood thinner  -Treatment includes reassurance -Reevaluation of the patient after these medicines showed that the patient improved -PCP office notes or outside notes reviewed -Escalation to admission/observation considered: patients feels much better, is comfortable with discharge, and will follow up with PCP -Prescription medication considered, patient comfortable with continuing with current medication and OTC tylenol as needed for pain -Social Determinant of Health  considered          Final Clinical Impression(s) / ED Diagnoses Final diagnoses:  Contusion of abdominal wall, initial encounter  Sprain of right ankle, unspecified ligament, initial encounter    Rx / DC Orders ED Discharge Orders     None         Fayrene Helper, PA-C 11/07/22 1404    Terald Sleeper, MD 11/09/22 1200

## 2022-11-07 NOTE — Discharge Instructions (Signed)
You have been evaluated for your symptoms.  You are likely experiencing a contusion of your abdominal wall.  Take Tylenol as needed for pain.  Apply ice for compress to abdominal wall several times a day for comfort.  If you notice bloody stool, urinating blood, or having worsening abdominal pain please return.  Wear Ace wrap or ankle brace for support of your right ankle.

## 2022-11-13 ENCOUNTER — Encounter (HOSPITAL_COMMUNITY): Payer: Commercial Managed Care - PPO

## 2022-11-13 ENCOUNTER — Ambulatory Visit: Payer: Commercial Managed Care - PPO | Admitting: Vascular Surgery

## 2022-12-11 ENCOUNTER — Other Ambulatory Visit: Payer: Self-pay | Admitting: *Deleted

## 2022-12-11 DIAGNOSIS — I87002 Postthrombotic syndrome without complications of left lower extremity: Secondary | ICD-10-CM

## 2022-12-11 DIAGNOSIS — I82412 Acute embolism and thrombosis of left femoral vein: Secondary | ICD-10-CM

## 2022-12-18 ENCOUNTER — Ambulatory Visit (HOSPITAL_COMMUNITY)
Admission: RE | Admit: 2022-12-18 | Discharge: 2022-12-18 | Disposition: A | Discharge status: Home / Self Care | Payer: Commercial Managed Care - PPO | Source: Ambulatory Visit | Attending: Vascular Surgery | Admitting: Vascular Surgery

## 2022-12-18 ENCOUNTER — Encounter: Payer: Self-pay | Admitting: Vascular Surgery

## 2022-12-18 ENCOUNTER — Ambulatory Visit: Payer: Commercial Managed Care - PPO | Admitting: Vascular Surgery

## 2022-12-18 VITALS — BP 145/89 | HR 88 | Temp 98.3°F | Resp 20 | Ht 65.0 in | Wt 225.0 lb

## 2022-12-18 DIAGNOSIS — I87002 Postthrombotic syndrome without complications of left lower extremity: Secondary | ICD-10-CM

## 2022-12-18 DIAGNOSIS — I82412 Acute embolism and thrombosis of left femoral vein: Secondary | ICD-10-CM | POA: Diagnosis present

## 2022-12-18 NOTE — Progress Notes (Signed)
[name], female   DOB: 1970/11/08, 52 y.o.   MRN: [id number]  Reason for Consult: Follow-up   Referred by Cleatis Polka., MD  Subjective:     HPI:  Jean Huber is a 52 y.o. female had a history of extensive left lower extremity DVT earlier this year at the time of that she had COVID.  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  She states that now she is wearing compression stockings has persistent swelling of the left lower extremity has remained on Eliquis daily.  She is nearly out of the Eliquis.  She is planning for cruise tomorrow which will last for 1 week.  Past Medical History:  Diagnosis Date   Allergy    Anemia    past hx    Anxiety    Asthma    Constipation    no meds-    COVID-19 virus infection    05-25-2019   DVT (deep venous thrombosis) (HCC)    GERD (gastroesophageal reflux disease)    past hx - no meds now    Neuromuscular disorder (HCC)    fibromyalgia    Palpitations    due to Thyroid    Thyroid disease    Family History  Problem Relation Age of Onset   Breast cancer Maternal Aunt    Colon cancer Paternal Aunt    Kidney cancer Paternal Grandmother    Bladder Cancer Paternal Grandmother    Pancreatic cancer Paternal Grandfather    Colon cancer Cousin    Colon polyps Neg Hx    Esophageal cancer Neg Hx    Rectal cancer Neg Hx    Stomach cancer Neg Hx    Past Surgical History:  Procedure Laterality Date   APPENDECTOMY     CARPAL TUNNEL RELEASE Left    CESAREAN SECTION     x 1    COLONOSCOPY     age 13 in [place] due to diarrhea/ constipation    ROTATOR CUFF REPAIR Right    SPINAL FUSION     L4-L5   TONSILLECTOMY      Short Social History:  Social History   Tobacco Use   Smoking status: Never   Smokeless tobacco: Never  Substance Use Topics   Alcohol use: Yes    Comment: occ only      Allergies  Allergen Reactions   Iodinated Casein Anaphylaxis   Ivp Dye [Iodinated Contrast Media] Anaphylaxis   Rocephin [Ceftriaxone] Hives and Nausea And Vomiting   Shellfish Allergy Anaphylaxis   Erythromycin Hives, Diarrhea and Nausea And Vomiting   Diclofenac Hives    Current Outpatient Medications  Medication Sig Dispense Refill   albuterol (PROVENTIL) (2.5 MG/3ML) 0.083% nebulizer solution Take 2.5 mg by nebulization every 4 (four) hours as needed for wheezing or shortness of breath.      albuterol (VENTOLIN HFA) 108 (90 Base) MCG/ACT inhaler Inhale 1 puff into the lungs every 6 (six) hours as needed for wheezing or shortness of breath.     apixaban (ELIQUIS) 5 MG TABS tablet Take 2 tablets (10 mg) twice daily for 7 days then take 1 tablet (5 mg) twice daily for 6 months. (Patient taking differently: Take 5 mg by mouth 2 (two) times daily.) 60 tablet 6   Ascorbic Acid (VITAMIN C PO) Take 1 tablet by mouth daily.     Budeson-Glycopyrrol-Formoterol (BREZTRI  AEROSPHERE) 160-9-4.8 MCG/ACT AERO Inhale 2 each into the lungs 2 times daily at 12 noon and 4 pm.     Cholecalciferol (VITAMIN D3) 125 MCG (5000 UT) capsule Take 5,000 Units by mouth daily.     diazepam (VALIUM) 2 MG tablet Take 2 mg by mouth daily as needed for anxiety.     Dupilumab (DUPIXENT) 300 MG/2ML SOPN INJECT 300 MG UNDER THE SKIN EVERY 14 DAYS 4 mL 3   EPINEPHrine 0.3 mg/0.3 mL IJ SOAJ injection Inject 0.3 mg into the muscle as needed for anaphylaxis.      fluticasone (FLONASE) 50 MCG/ACT nasal spray Place 2 sprays into both nostrils daily as needed for allergies.     levothyroxine (SYNTHROID) 75 MCG tablet Take 75 mcg by mouth daily before breakfast.     NUCYNTA 50 MG tablet Take 50 mg by mouth 2 (two) times daily as needed for moderate pain.      ondansetron (ZOFRAN) 4 MG tablet Take 1 tablet (4 mg total) by mouth daily as needed for nausea or vomiting. 15 tablet 1   No current facility-administered medications  for this visit.    Review of Systems  Constitutional:  Constitutional negative. HENT: HENT negative.  Eyes: Eyes negative.  Respiratory: Respiratory negative.  Cardiovascular: Positive for leg swelling.  GI: Gastrointestinal negative.  Musculoskeletal: Positive for leg pain.  Skin: Skin negative.  Neurological: Neurological negative. Hematologic: Hematologic/lymphatic negative.  Psychiatric: Psychiatric negative.        Objective:  Objective   Vitals:   12/18/22 1339  BP: (!) 145/89  Pulse: 88  Resp: 20  Temp: 98.3 F (36.8 C)  SpO2: 98%  Weight: 225 lb (102.1 kg)  Height: 5\' 5"  (1.651 m)   Body mass index is 37.44 kg/m.  Physical Exam HENT:     Head: Normocephalic.     Mouth/Throat:     Mouth: Mucous membranes are moist.  Eyes:     Pupils: Pupils are equal, round, and reactive to light.  Cardiovascular:     Rate and Rhythm: Normal rate.     Pulses: Normal pulses.  Pulmonary:     Effort: Pulmonary effort is normal.  Abdominal:     General: Abdomen is flat.  Musculoskeletal:     Cervical back: Normal range of motion and neck supple.     Right lower leg: No edema.     Left lower leg: Edema present.     Comments: Right leg measures 43 cm/left leg 47.5 cm (measured 10 cm from tibial tuberosity)  Skin:    General: Skin is warm.     Capillary Refill: Capillary refill takes less than 2 seconds.  Neurological:     General: No focal deficit present.     Mental Status: She is alert.  Psychiatric:        Mood and Affect: Mood normal.        Thought Content: Thought content normal.        Judgment: Judgment normal.     Data: LEFT          Reflux NoRefluxReflux TimeDiameter cmsComments                                  Yes                                            +--------------+---------+------+-----------+------------+----------------+  CFV                    yes   >1 second                                 +--------------+---------+------+-----------+------------+----------------+   FV mid        no                                    chronic  thrombus  +--------------+---------+------+-----------+------------+----------------+   FV dist                                             chronic  thrombus  +--------------+---------+------+-----------+------------+----------------+   Popliteal    no                                                       +--------------+---------+------+-----------+------------+----------------+   GSV at SFJ              yes    >500 ms      .850                       +--------------+---------+------+-----------+------------+----------------+   GSV prox thighno                            .403                       +--------------+---------+------+-----------+------------+----------------+   GSV mid thigh no                            .347                       +--------------+---------+------+-----------+------------+----------------+   GSV dist thighno                            .329                       +--------------+---------+------+-----------+------------+----------------+   GSV at knee   no                            .372                       +--------------+---------+------+-----------+------------+----------------+   GSV prox calf no                            .303                       +--------------+---------+------+-----------+------------+----------------+   SSV Pop Fossa no                            .390                       +--------------+---------+------+-----------+------------+----------------+  SSV prox calf no                            .292                       +--------------+---------+------+-----------+------------+----------------+          Summary:  Left:  - Venous reflux is noted in the left common femoral vein.  - Venous reflux  is noted in the left sapheno-femoral junction.  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  She was planned for left lower extremity venogram but that her leg symptoms completely resolved.  She has been compliant with compression stockings and still has approximately 10% edema of the left lower extremity relative to the right.  She remains on Eliquis although was about to discontinue this and transition to baby aspirin.  She has anxiety history of history of DVT and I discussed that she did have COVID at the time of her DVT but also made her aware of the significant risk factor.  As such we will plan for left sided venogram from a left common femoral or femoral vein approach to evaluate for possible stenting of her left common iliac vein which pretty clearly has May-Thurner configuration by most recent CT scan which we reviewed together today.    She does have anaphylaxis to contrast so we will premedicate her and limit the amount of contrast and plan for most of the procedure with intravascular ultrasound.  I discussed the risk and benefits including the possibility of pain postprocedural and need for antiplatelet therapy with stent placement.  She demonstrates good understanding we will get her scheduled for Monday in the near future.     Maeola Harman MD [facility]

## 2022-12-18 NOTE — H&P (View-Only) (Signed)
[name], female   DOB: 1970/11/08, 52 y.o.   MRN: [id number]  Reason for Consult: Follow-up   Referred by Cleatis Polka., MD  Subjective:     HPI:  Jean SCHMELZ is a 52 y.o. female had a history of extensive left lower extremity DVT earlier this year at the time of that she had COVID.  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  She states that now she is wearing compression stockings has persistent swelling of the left lower extremity has remained on Eliquis daily.  She is nearly out of the Eliquis.  She is planning for cruise tomorrow which will last for 1 week.  Past Medical History:  Diagnosis Date   Allergy    Anemia    past hx    Anxiety    Asthma    Constipation    no meds-    COVID-19 virus infection    05-25-2019   DVT (deep venous thrombosis) (HCC)    GERD (gastroesophageal reflux disease)    past hx - no meds now    Neuromuscular disorder (HCC)    fibromyalgia    Palpitations    due to Thyroid    Thyroid disease    Family History  Problem Relation Age of Onset   Breast cancer Maternal Aunt    Colon cancer Paternal Aunt    Kidney cancer Paternal Grandmother    Bladder Cancer Paternal Grandmother    Pancreatic cancer Paternal Grandfather    Colon cancer Cousin    Colon polyps Neg Hx    Esophageal cancer Neg Hx    Rectal cancer Neg Hx    Stomach cancer Neg Hx    Past Surgical History:  Procedure Laterality Date   APPENDECTOMY     CARPAL TUNNEL RELEASE Left    CESAREAN SECTION     x 1    COLONOSCOPY     age 13 in [place] due to diarrhea/ constipation    ROTATOR CUFF REPAIR Right    SPINAL FUSION     L4-L5   TONSILLECTOMY      Short Social History:  Social History   Tobacco Use   Smoking status: Never   Smokeless tobacco: Never  Substance Use Topics   Alcohol use: Yes    Comment: occ only      Allergies  Allergen Reactions   Iodinated Casein Anaphylaxis   Ivp Dye [Iodinated Contrast Media] Anaphylaxis   Rocephin [Ceftriaxone] Hives and Nausea And Vomiting   Shellfish Allergy Anaphylaxis   Erythromycin Hives, Diarrhea and Nausea And Vomiting   Diclofenac Hives    Current Outpatient Medications  Medication Sig Dispense Refill   albuterol (PROVENTIL) (2.5 MG/3ML) 0.083% nebulizer solution Take 2.5 mg by nebulization every 4 (four) hours as needed for wheezing or shortness of breath.      albuterol (VENTOLIN HFA) 108 (90 Base) MCG/ACT inhaler Inhale 1 puff into the lungs every 6 (six) hours as needed for wheezing or shortness of breath.     apixaban (ELIQUIS) 5 MG TABS tablet Take 2 tablets (10 mg) twice daily for 7 days then take 1 tablet (5 mg) twice daily for 6 months. (Patient taking differently: Take 5 mg by mouth 2 (two) times daily.) 60 tablet 6   Ascorbic Acid (VITAMIN C PO) Take 1 tablet by mouth daily.     Budeson-Glycopyrrol-Formoterol (BREZTRI  AEROSPHERE) 160-9-4.8 MCG/ACT AERO Inhale 2 each into the lungs 2 times daily at 12 noon and 4 pm.     Cholecalciferol (VITAMIN D3) 125 MCG (5000 UT) capsule Take 5,000 Units by mouth daily.     diazepam (VALIUM) 2 MG tablet Take 2 mg by mouth daily as needed for anxiety.     Dupilumab (DUPIXENT) 300 MG/2ML SOPN INJECT 300 MG UNDER THE SKIN EVERY 14 DAYS 4 mL 3   EPINEPHrine 0.3 mg/0.3 mL IJ SOAJ injection Inject 0.3 mg into the muscle as needed for anaphylaxis.      fluticasone (FLONASE) 50 MCG/ACT nasal spray Place 2 sprays into both nostrils daily as needed for allergies.     levothyroxine (SYNTHROID) 75 MCG tablet Take 75 mcg by mouth daily before breakfast.     NUCYNTA 50 MG tablet Take 50 mg by mouth 2 (two) times daily as needed for moderate pain.      ondansetron (ZOFRAN) 4 MG tablet Take 1 tablet (4 mg total) by mouth daily as needed for nausea or vomiting. 15 tablet 1   No current facility-administered medications  for this visit.    Review of Systems  Constitutional:  Constitutional negative. HENT: HENT negative.  Eyes: Eyes negative.  Respiratory: Respiratory negative.  Cardiovascular: Positive for leg swelling.  GI: Gastrointestinal negative.  Musculoskeletal: Positive for leg pain.  Skin: Skin negative.  Neurological: Neurological negative. Hematologic: Hematologic/lymphatic negative.  Psychiatric: Psychiatric negative.        Objective:  Objective   Vitals:   12/18/22 1339  BP: (!) 145/89  Pulse: 88  Resp: 20  Temp: 98.3 F (36.8 C)  SpO2: 98%  Weight: 225 lb (102.1 kg)  Height: 5\' 5"  (1.651 m)   Body mass index is 37.44 kg/m.  Physical Exam HENT:     Head: Normocephalic.     Mouth/Throat:     Mouth: Mucous membranes are moist.  Eyes:     Pupils: Pupils are equal, round, and reactive to light.  Cardiovascular:     Rate and Rhythm: Normal rate.     Pulses: Normal pulses.  Pulmonary:     Effort: Pulmonary effort is normal.  Abdominal:     General: Abdomen is flat.  Musculoskeletal:     Cervical back: Normal range of motion and neck supple.     Right lower leg: No edema.     Left lower leg: Edema present.     Comments: Right leg measures 43 cm/left leg 47.5 cm (measured 10 cm from tibial tuberosity)  Skin:    General: Skin is warm.     Capillary Refill: Capillary refill takes less than 2 seconds.  Neurological:     General: No focal deficit present.     Mental Status: She is alert.  Psychiatric:        Mood and Affect: Mood normal.        Thought Content: Thought content normal.        Judgment: Judgment normal.     Data: LEFT          Reflux NoRefluxReflux TimeDiameter cmsComments                                  Yes                                            +--------------+---------+------+-----------+------------+----------------+  CFV                    yes   >1 second                                 +--------------+---------+------+-----------+------------+----------------+   FV mid        no                                    chronic  thrombus  +--------------+---------+------+-----------+------------+----------------+   FV dist                                             chronic  thrombus  +--------------+---------+------+-----------+------------+----------------+   Popliteal    no                                                       +--------------+---------+------+-----------+------------+----------------+   GSV at SFJ              yes    >500 ms      .850                       +--------------+---------+------+-----------+------------+----------------+   GSV prox thighno                            .403                       +--------------+---------+------+-----------+------------+----------------+   GSV mid thigh no                            .347                       +--------------+---------+------+-----------+------------+----------------+   GSV dist thighno                            .329                       +--------------+---------+------+-----------+------------+----------------+   GSV at knee   no                            .372                       +--------------+---------+------+-----------+------------+----------------+   GSV prox calf no                            .303                       +--------------+---------+------+-----------+------------+----------------+   SSV Pop Fossa no                            .390                       +--------------+---------+------+-----------+------------+----------------+  SSV prox calf no                            .292                       +--------------+---------+------+-----------+------------+----------------+          Summary:  Left:  - Venous reflux is noted in the left common femoral vein.  - Venous reflux  is noted in the left sapheno-femoral junction.  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  She was planned for left lower extremity venogram but that her leg symptoms completely resolved.  She has been compliant with compression stockings and still has approximately 10% edema of the left lower extremity relative to the right.  She remains on Eliquis although was about to discontinue this and transition to baby aspirin.  She has anxiety history of history of DVT and I discussed that she did have COVID at the time of her DVT but also made her aware of the significant risk factor.  As such we will plan for left sided venogram from a left common femoral or femoral vein approach to evaluate for possible stenting of her left common iliac vein which pretty clearly has May-Thurner configuration by most recent CT scan which we reviewed together today.    She does have anaphylaxis to contrast so we will premedicate her and limit the amount of contrast and plan for most of the procedure with intravascular ultrasound.  I discussed the risk and benefits including the possibility of pain postprocedural and need for antiplatelet therapy with stent placement.  She demonstrates good understanding we will get her scheduled for Monday in the near future.     Maeola Harman MD [facility]

## 2022-12-18 NOTE — Progress Notes (Deleted)
Established Patient Office Visit  Subjective   [name], female    DOB: 08/25/1970  Age: 52 y.o. MRN: [id number]  No chief complaint on file.   HPI  {History (Optional):23778}  ROS    Objective:     LMP 07/04/2019 (Approximate) Comment: husband has vascectomy {Vitals History (Optional):23777}  Physical Exam   No results found for any visits on 12/18/22.  {Labs (Optional):23779}  The ASCVD Risk score (Arnett DK, et al., 2019) failed to calculate for the following reasons:   Cannot find a previous HDL lab   Cannot find a previous total cholesterol lab    Assessment & Plan:   Problem List Items Addressed This Visit   None   No follow-ups on file.    Lemar Livings, MD

## 2022-12-23 ENCOUNTER — Telehealth: Payer: Self-pay

## 2022-12-23 NOTE — Telephone Encounter (Signed)
Attempted to reach patient to schedule left lower extremity venogram. Left VM for patient to return call.

## 2022-12-26 ENCOUNTER — Encounter: Payer: Self-pay | Admitting: *Deleted

## 2022-12-26 ENCOUNTER — Other Ambulatory Visit: Payer: Self-pay | Admitting: *Deleted

## 2022-12-26 DIAGNOSIS — I871 Compression of vein: Secondary | ICD-10-CM

## 2023-01-13 ENCOUNTER — Telehealth: Payer: Self-pay | Admitting: Vascular Surgery

## 2023-01-13 ENCOUNTER — Other Ambulatory Visit: Payer: Self-pay

## 2023-01-13 ENCOUNTER — Ambulatory Visit (HOSPITAL_COMMUNITY)
Admission: RE | Admit: 2023-01-13 | Discharge: 2023-01-13 | Disposition: A | Discharge status: Home / Self Care | Payer: Commercial Managed Care - PPO | Attending: Vascular Surgery | Admitting: Vascular Surgery

## 2023-01-13 ENCOUNTER — Encounter (HOSPITAL_COMMUNITY)
Admission: RE | Disposition: A | Discharge status: Home / Self Care | Payer: Self-pay | Source: Home / Self Care | Attending: Vascular Surgery

## 2023-01-13 DIAGNOSIS — Z7901 Long term (current) use of anticoagulants: Secondary | ICD-10-CM | POA: Diagnosis not present

## 2023-01-13 DIAGNOSIS — I87002 Postthrombotic syndrome without complications of left lower extremity: Secondary | ICD-10-CM | POA: Insufficient documentation

## 2023-01-13 DIAGNOSIS — F419 Anxiety disorder, unspecified: Secondary | ICD-10-CM | POA: Insufficient documentation

## 2023-01-13 DIAGNOSIS — I871 Compression of vein: Secondary | ICD-10-CM

## 2023-01-13 HISTORY — PX: PERIPHERAL VASCULAR ULTRASOUND/IVUS: CATH118334

## 2023-01-13 LAB — POCT I-STAT, CHEM 8
BUN: 13 mg/dL (ref 6–20)
BUN: 18 mg/dL (ref 6–20)
Calcium, Ion: 1.2 mmol/L (ref 1.15–1.40)
Calcium, Ion: 1.21 mmol/L (ref 1.15–1.40)
Chloride: 106 mmol/L (ref 98–111)
Chloride: 107 mmol/L (ref 98–111)
Creatinine, Ser: 0.7 mg/dL (ref 0.44–1.00)
Creatinine, Ser: 0.7 mg/dL (ref 0.44–1.00)
Glucose, Bld: 93 mg/dL (ref 70–99)
Glucose, Bld: 94 mg/dL (ref 70–99)
HCT: 38 % (ref 36.0–46.0)
HCT: 40 % (ref 36.0–46.0)
Hemoglobin: 12.9 g/dL (ref 12.0–15.0)
Hemoglobin: 13.6 g/dL (ref 12.0–15.0)
Potassium: 3.6 mmol/L (ref 3.5–5.1)
Potassium: 5.7 mmol/L — ABNORMAL HIGH (ref 3.5–5.1)
Sodium: 140 mmol/L (ref 135–145)
Sodium: 141 mmol/L (ref 135–145)
TCO2: 22 mmol/L (ref 22–32)
TCO2: 27 mmol/L (ref 22–32)

## 2023-01-13 SURGERY — PERIPHERAL VASCULAR ULTRASOUND/IVUS

## 2023-01-13 MED ORDER — ACETAMINOPHEN 325 MG PO TABS: 650.0000 mg | ORAL_TABLET | ORAL | Status: DC | PRN

## 2023-01-13 MED ORDER — FENTANYL CITRATE (PF) 100 MCG/2ML IJ SOLN
INTRAMUSCULAR | Status: AC
  Filled 2023-01-13: qty 2

## 2023-01-13 MED ORDER — METHYLPREDNISOLONE SODIUM SUCC 125 MG IJ SOLR
INTRAMUSCULAR | Status: AC
  Administered 2023-01-13: 125 mg via INTRAVENOUS
  Filled 2023-01-13: qty 2

## 2023-01-13 MED ORDER — LIDOCAINE HCL (PF) 1 % IJ SOLN
INTRAMUSCULAR | Status: DC | PRN
  Administered 2023-01-13: 18 mL

## 2023-01-13 MED ORDER — DIPHENHYDRAMINE HCL 50 MG/ML IJ SOLN: 25.0000 mg | INTRAMUSCULAR | Status: AC

## 2023-01-13 MED ORDER — SODIUM CHLORIDE 0.9 % IV SOLN: INTRAVENOUS | Status: DC

## 2023-01-13 MED ORDER — DIPHENHYDRAMINE HCL 50 MG/ML IJ SOLN
INTRAMUSCULAR | Status: AC
  Administered 2023-01-13: 25 mg via INTRAVENOUS
  Filled 2023-01-13: qty 1

## 2023-01-13 MED ORDER — HYDRALAZINE HCL 20 MG/ML IJ SOLN: 5.0000 mg | INTRAMUSCULAR | Status: DC | PRN

## 2023-01-13 MED ORDER — HEPARIN (PORCINE) IN NACL 1000-0.9 UT/500ML-% IV SOLN
INTRAVENOUS | Status: DC | PRN
  Administered 2023-01-13: 500 mL

## 2023-01-13 MED ORDER — LIDOCAINE HCL (PF) 1 % IJ SOLN
INTRAMUSCULAR | Status: AC
  Filled 2023-01-13: qty 30

## 2023-01-13 MED ORDER — MORPHINE SULFATE (PF) 2 MG/ML IV SOLN: 2.0000 mg | INTRAVENOUS | Status: DC | PRN

## 2023-01-13 MED ORDER — ONDANSETRON HCL 4 MG/2ML IJ SOLN: 4.0000 mg | Freq: Four times a day (QID) | INTRAMUSCULAR | Status: DC | PRN

## 2023-01-13 MED ORDER — MIDAZOLAM HCL 2 MG/2ML IJ SOLN
INTRAMUSCULAR | Status: DC | PRN
  Administered 2023-01-13: 2 mg via INTRAVENOUS

## 2023-01-13 MED ORDER — SODIUM CHLORIDE 0.9 % IV SOLN: 250.0000 mL | INTRAVENOUS | Status: DC | PRN

## 2023-01-13 MED ORDER — SODIUM CHLORIDE 0.9% FLUSH: 3.0000 mL | INTRAVENOUS | Status: DC | PRN

## 2023-01-13 MED ORDER — SODIUM CHLORIDE 0.9% FLUSH: 3.0000 mL | Freq: Two times a day (BID) | INTRAVENOUS | Status: DC

## 2023-01-13 MED ORDER — LABETALOL HCL 5 MG/ML IV SOLN: 10.0000 mg | INTRAVENOUS | Status: DC | PRN

## 2023-01-13 MED ORDER — OXYCODONE HCL 5 MG PO TABS: 5.0000 mg | ORAL_TABLET | ORAL | Status: DC | PRN

## 2023-01-13 MED ORDER — FENTANYL CITRATE (PF) 100 MCG/2ML IJ SOLN
INTRAMUSCULAR | Status: DC | PRN
  Administered 2023-01-13: 50 ug via INTRAVENOUS

## 2023-01-13 MED ORDER — MIDAZOLAM HCL 2 MG/2ML IJ SOLN
INTRAMUSCULAR | Status: AC
  Filled 2023-01-13: qty 2

## 2023-01-13 MED ORDER — METHYLPREDNISOLONE SODIUM SUCC 125 MG IJ SOLR: 125.0000 mg | INTRAMUSCULAR | Status: AC

## 2023-01-13 SURGICAL SUPPLY — 9 items
CATH VISIONS PV .035 IVUS (CATHETERS) ×1 IMPLANT
COVER DOME SNAP 22 D (MISCELLANEOUS) ×1 IMPLANT
GLIDEWIRE ADV .035X260CM (WIRE) ×1 IMPLANT
KIT MICROPUNCTURE NIT STIFF (SHEATH) ×1 IMPLANT
KIT SYRINGE INJ CVI SPIKEX1 (MISCELLANEOUS) ×1 IMPLANT
SET ATX-X65L (MISCELLANEOUS) ×1 IMPLANT
SHEATH PINNACLE 8F 10CM (SHEATH) ×1 IMPLANT
SHEATH PROBE COVER 6X72 (BAG) ×1 IMPLANT
TRAY PV CATH (CUSTOM PROCEDURE TRAY) ×2 IMPLANT

## 2023-01-13 NOTE — Interval H&P Note (Signed)
History and Physical Interval Note:  01/13/2023 7:25 AM  Jean Huber  has presented today for surgery, with the diagnosis of may-turner syndrome.  The various methods of treatment have been discussed with the patient and family. After consideration of risks, benefits and other options for treatment, the patient has consented to  Procedure(s): LOWER EXTREMITY VENOGRAPHY (Left) as a surgical intervention.  The patient's history has been reviewed, patient examined, no change in status, stable for surgery.  I have reviewed the patient's chart and labs.  Questions were answered to the patient's satisfaction.     Lemar Livings

## 2023-01-13 NOTE — Telephone Encounter (Signed)
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 Randie Heinz, MD   Procedure Performed: 1.  Ultrasound-guided cannulation left greater saphenous vein 2.  Intravascular ultrasound left external and common iliac veins and IVC 3.  Moderate sedation with fentanyl Versed for 23 minutes   Please have patient follow-up with me in approximately 6 months with IVC iliac duplex.  [name]

## 2023-01-13 NOTE — Telephone Encounter (Signed)
Appt has been scheduled.

## 2023-01-13 NOTE — Progress Notes (Signed)
Patient and husband was given discharge instructions. Both verbalized understanding.

## 2023-01-13 NOTE — Op Note (Signed)
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 Randie Heinz, MD Procedure Performed: 1.  Ultrasound-guided cannulation left greater saphenous vein 2.  Intravascular ultrasound left external and common iliac veins and IVC 3.  Moderate sedation with fentanyl Versed for 23 minutes  Indications: 52 year old female with a history of left lower extremity swelling for several decades with extensive left lower extremity DVT last year but symptoms mostly resolved with residual 10% swelling of the left lower extremity consistent with post thrombotic syndrome.  She has CT evidence of May-Thurner configuration and is indicated for venogram with intravascular ultrasound evaluation.  Patient has had anaphylaxis in the past to contrast and will prefer no contrast administration if possible.  Findings: There is a total area of 155 mm of the common iliac vein where it appears normal.  At the crossing of the right common iliac artery over the left common iliac vein just at the IVC confluence there is an area of 74.6 mm.  This equals 52% reduction of area by volume.  Given her minor symptoms and symptoms that may be related to chronic thrombosis of her left femoral vein after extensive DVT with just over 50% May-Thurner configuration we discussed not placing a stent.  The IVC was patent there was no evidence of ongoing thrombosis in the iliac veins or IVC.   Procedure:  The patient was identified in the holding area and taken to room 8.  The patient was then placed supine on the table and prepped and draped in the usual sterile fashion.  A time out was called.  Ultrasound was used to evaluate the left common femoral vein which was patent and compressible and there was a very large great saphenous vein in the area around this was  anesthetized and cannulated with a micropuncture needle followed by wire and the sheath.  An ultrasound image was saved to the permanent record.  Concomitantly we administered fentanyl and Versed as moderate sedation and her vital signs were monitored throughout the case.  I placed the Glidewire advantage which did briskly traverse the common iliac and into the IVC under fluoroscopic guidance.  I then placed an 8 [place] sheath.  The guidewire advantage was then placed into the right subclavian vein and the back of the wire was marked.  We then began with intravascular ultrasound to include the external and common iliac veins on the left as well as the IVC.  With just over 50% area reduction I elected for no stenting and conversation with the patient.  The wire and catheter were removed and the sheath was removed and pressure was held until hemostasis was obtained.  The patient tolerated the procedure without immediate complication.   Contrast: Bunnie Domino C. Randie Heinz, MD [facility] Office: [phone] Pager: [phone]

## 2023-01-14 ENCOUNTER — Encounter (HOSPITAL_COMMUNITY): Payer: Self-pay | Admitting: Vascular Surgery

## 2023-04-04 ENCOUNTER — Other Ambulatory Visit: Payer: Self-pay | Admitting: Internal Medicine

## 2023-04-04 ENCOUNTER — Other Ambulatory Visit: Payer: Self-pay | Admitting: Orthopedic Surgery

## 2023-04-04 DIAGNOSIS — S62001A Unspecified fracture of navicular [scaphoid] bone of right wrist, initial encounter for closed fracture: Secondary | ICD-10-CM

## 2023-04-07 ENCOUNTER — Other Ambulatory Visit: Payer: Commercial Managed Care - PPO

## 2023-04-07 ENCOUNTER — Ambulatory Visit
Admission: RE | Admit: 2023-04-07 | Discharge: 2023-04-07 | Disposition: A | Discharge status: Home / Self Care | Payer: Commercial Managed Care - PPO | Source: Ambulatory Visit | Attending: Orthopedic Surgery | Admitting: Orthopedic Surgery

## 2023-04-07 DIAGNOSIS — S62001A Unspecified fracture of navicular [scaphoid] bone of right wrist, initial encounter for closed fracture: Secondary | ICD-10-CM

## 2023-04-15 ENCOUNTER — Encounter: Payer: Self-pay | Admitting: Orthopedic Surgery

## 2023-05-28 ENCOUNTER — Ambulatory Visit
Admission: RE | Admit: 2023-05-28 | Discharge: 2023-05-28 | Disposition: A | Discharge status: Home / Self Care | Payer: Commercial Managed Care - PPO | Source: Ambulatory Visit | Attending: Family | Admitting: Family

## 2023-05-28 ENCOUNTER — Other Ambulatory Visit: Payer: Self-pay | Admitting: Family

## 2023-05-28 DIAGNOSIS — N6312 Unspecified lump in the right breast, upper inner quadrant: Secondary | ICD-10-CM

## 2023-06-05 ENCOUNTER — Encounter: Payer: Self-pay | Admitting: Vascular Surgery

## 2023-07-23 ENCOUNTER — Ambulatory Visit: Payer: Commercial Managed Care - PPO | Admitting: Vascular Surgery

## 2023-07-23 ENCOUNTER — Encounter (HOSPITAL_COMMUNITY): Payer: Commercial Managed Care - PPO

## 2023-08-04 ENCOUNTER — Other Ambulatory Visit: Payer: Self-pay

## 2023-08-04 DIAGNOSIS — I871 Compression of vein: Secondary | ICD-10-CM

## 2023-08-13 ENCOUNTER — Ambulatory Visit: Attending: Vascular Surgery | Admitting: Vascular Surgery

## 2023-08-13 ENCOUNTER — Ambulatory Visit (HOSPITAL_COMMUNITY)
Admission: RE | Admit: 2023-08-13 | Discharge: 2023-08-13 | Disposition: A | Discharge status: Home / Self Care | Source: Ambulatory Visit | Attending: Vascular Surgery | Admitting: Vascular Surgery

## 2023-08-13 ENCOUNTER — Encounter: Payer: Self-pay | Admitting: Vascular Surgery

## 2023-08-13 VITALS — BP 123/85 | HR 77 | Temp 98.4°F | Ht 65.0 in | Wt 219.0 lb

## 2023-08-13 DIAGNOSIS — I871 Compression of vein: Secondary | ICD-10-CM | POA: Insufficient documentation

## 2023-08-13 DIAGNOSIS — I87002 Postthrombotic syndrome without complications of left lower extremity: Secondary | ICD-10-CM | POA: Diagnosis not present

## 2023-08-13 NOTE — Progress Notes (Signed)
 [name], female   DOB: [birth year], 53 y.o.   MRN: [id number]  Reason for Consult: Follow-up   Referred by Jeannine Milroy., MD  Subjective:     HPI:  Jean Huber is a 53 y.o. female with history of extensive left lower extremity DVT while she had COVID.  She also has several decades of left lower extremity swelling.  She continues to wear compression stockings.  She has undergone venogram which demonstrated approximately 50% area reduction of the left common iliac vein and no intervention was undertaken.  She remains on aspirin .  She does not have any new complaints today.  Past Medical History:  Diagnosis Date   Allergy     Anemia    past hx    Anxiety    Asthma    Constipation    no meds-    COVID-19 virus infection    05-25-2019   DVT (deep venous thrombosis) (HCC)    GERD (gastroesophageal reflux disease)    past hx - no meds now    Neuromuscular disorder (HCC)    fibromyalgia    Palpitations    due to Thyroid    Thyroid disease    Family History  Problem Relation Age of Onset   Breast cancer Maternal Aunt    Colon cancer Paternal Aunt    Kidney cancer Paternal Grandmother    Bladder Cancer Paternal Grandmother    Pancreatic cancer Paternal Grandfather    Colon cancer Cousin    Colon polyps Neg Hx    Esophageal cancer Neg Hx    Rectal cancer Neg Hx    Stomach cancer Neg Hx    Past Surgical History:  Procedure Laterality Date   APPENDECTOMY     CARPAL TUNNEL RELEASE Left    CESAREAN SECTION     x 1    COLONOSCOPY     age 27 in [place] due to diarrhea/ constipation    PERIPHERAL VASCULAR ULTRASOUND/IVUS  01/13/2023   Procedure: Peripheral Vascular Ultrasound/IVUS;  Surgeon: Adine Hoof, MD;  Location: [facility] INVASIVE CV LAB;  Service: Cardiovascular;;   ROTATOR CUFF REPAIR Right    SPINAL FUSION     L4-L5   TONSILLECTOMY      Short Social History:  Social History   Tobacco Use   Smoking status: Never    Smokeless tobacco: Never  Substance Use Topics   Alcohol use: Yes    Comment: occ only     Allergies  Allergen Reactions   Iodinated Casein Anaphylaxis   Ivp Dye [Iodinated Contrast Media] Anaphylaxis   Rocephin [Ceftriaxone] Hives and Nausea And Vomiting   Shellfish Allergy  Anaphylaxis   Erythromycin Hives, Diarrhea and Nausea And Vomiting   Diclofenac Hives    Current Outpatient Medications  Medication Sig Dispense Refill   albuterol  (PROVENTIL ) (2.5 MG/3ML) 0.083% nebulizer solution Take 2.5 mg by nebulization every 4 (four) hours as needed for wheezing or shortness of breath.      albuterol  (VENTOLIN  HFA) 108 (90 Base) MCG/ACT inhaler Inhale 1 puff into the lungs every 6 (six) hours as needed for wheezing or shortness of breath.     apixaban  (ELIQUIS ) 5 MG TABS tablet Take 2 tablets (10 mg) twice daily for 7 days then take 1 tablet (5 mg) twice daily for 6 months. 60 tablet 6   Ascorbic Acid (VITAMIN C PO) Take 5,000 mg by mouth daily.     aspirin  EC 81 MG tablet Take 81 mg by mouth  daily. Swallow whole.     Budeson-Glycopyrrol-Formoterol (BREZTRI  AEROSPHERE) 160-9-4.8 MCG/ACT AERO Inhale 2 each into the lungs 2 times daily at 12 noon and 4 pm.     Cholecalciferol (VITAMIN D3) 125 MCG (5000 UT) capsule Take 5,000 Units by mouth daily.     EPINEPHrine  0.3 mg/0.3 mL IJ SOAJ injection Inject 0.3 mg into the muscle as needed for anaphylaxis.      fluticasone (FLONASE) 50 MCG/ACT nasal spray Place 2 sprays into both nostrils daily as needed for allergies.     levothyroxine  (SYNTHROID ) 75 MCG tablet Take 75 mcg by mouth daily before breakfast.     NUCYNTA 50 MG tablet Take 50 mg by mouth 2 (two) times daily as needed for moderate pain.      No current facility-administered medications for this visit.    Review of Systems  Constitutional:  Constitutional negative. HENT: HENT negative.  Eyes: Eyes negative.  Respiratory: Respiratory negative.  Cardiovascular: Positive for leg  swelling.  GI: Gastrointestinal negative.  Skin: Skin negative.  Neurological: Neurological negative. Hematologic: Hematologic/lymphatic negative.  Psychiatric: Psychiatric negative.        Objective:  Objective   Vitals:   08/13/23 1108  BP: 123/85  Pulse: 77  Temp: 98.4 F (36.9 C)  SpO2: 98%  Weight: 219 lb (99.3 kg)  Height: 5\' 5"  (1.651 m)   Body mass index is 36.44 kg/m.  Physical Exam HENT:     Head: Normocephalic.     Nose: Nose normal.  Eyes:     Pupils: Pupils are equal, round, and reactive to light.  Cardiovascular:     Rate and Rhythm: Normal rate.     Pulses: Normal pulses.  Pulmonary:     Effort: Pulmonary effort is normal.  Abdominal:     General: Abdomen is flat.  Musculoskeletal:     Right lower leg: No edema.     Left lower leg: Edema present.     Comments: Right leg measures 42 cm, left leg measures 46 cm (measured 10 cm from tibial tuberosity)  Skin:    Capillary Refill: Capillary refill takes less than 2 seconds.  Neurological:     General: No focal deficit present.     Mental Status: She is alert.  Psychiatric:        Mood and Affect: Mood normal.     Data: IVC/Iliac Findings:  +----------+------+--------+--------+    IVC    PatentThrombusComments  +----------+------+--------+--------+  IVC Prox  patent                  +----------+------+--------+--------+  IVC Mid   patent                  +----------+------+--------+--------+  IVC Distalpatent                  +----------+------+--------+--------+     +-------------------+---------+-----------+---------+-----------+--------+         CIV        RT-PatentRT-ThrombusLT-PatentLT-ThrombusComments  +-------------------+---------+-----------+---------+-----------+--------+  Common Iliac Prox                       patent                       +-------------------+---------+-----------+---------+-----------+--------+  Common Iliac Mid                         patent                       +-------------------+---------+-----------+---------+-----------+--------+  Common Iliac Distal                     patent                       +-------------------+---------+-----------+---------+-----------+--------+      +-------------------------+---------+-----------+---------+-----------+----  ----+            EIV            RT-PatentRT-ThrombusLT-PatentLT-ThrombusComments  +-------------------------+---------+-----------+---------+-----------+----  ----+  External Iliac Vein Prox                      patent                        +-------------------------+---------+-----------+---------+-----------+----  ----+  External Iliac Vein Mid                       patent                        +-------------------------+---------+-----------+---------+-----------+----  ----+  External Iliac Vein                           patent                        Distal                                                                      +-------------------------+---------+-----------+---------+-----------+----  ----+        Summary:  IVC/Iliac: There is no evidence of thrombus involving the IVC. There is no  evidence of thrombus involving the left common iliac vein. There is no  evidence of thrombus involving the left external iliac vein.      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  At the time of her endovascular evaluation we performed intravascular ultrasound and not venography due to contrast allergy .  She remains on aspirin  I recommended continue compression stockings and she can follow-up with me as needed.    Adine Hoof MD [facility]

## 2023-08-25 IMAGING — CT CT CARDIAC CORONARY ARTERY CALCIUM SCORE
3 series · 14 of 20 positions shown, 16 images · non-contrast
Comparison: 10/25/2020 chest CT
COMPARISON: 10/25/2020 chest CT

Addendum:
EXAM:
OVER-READ INTERPRETATION  CT CHEST

The following report is an over-read performed by radiologist Dr.
over-read does not include interpretation of cardiac or coronary
anatomy or pathology. The calcium score interpretation by the
cardiologist is attached.
TECHNIQUE: A gated, non-contrast computed tomography scan of the heart was
performed using 3mm slice thickness. Axial images were analyzed on a
dedicated workstation. Calcium scoring of the coronary arteries was
performed using the Agatston method.

[Series 2: cascseq 2.0 sa36 70% (id) · axial · 0.39mm/px · z∈[-249,-159]mm · 4 of 76 slices shown]
[im 16/76  vessel]
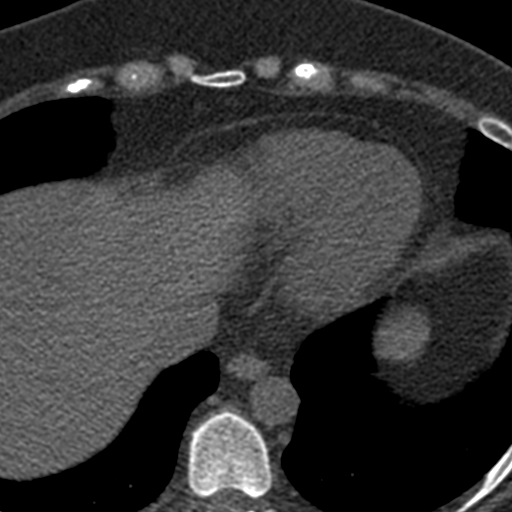
[im 31/76  vessel]
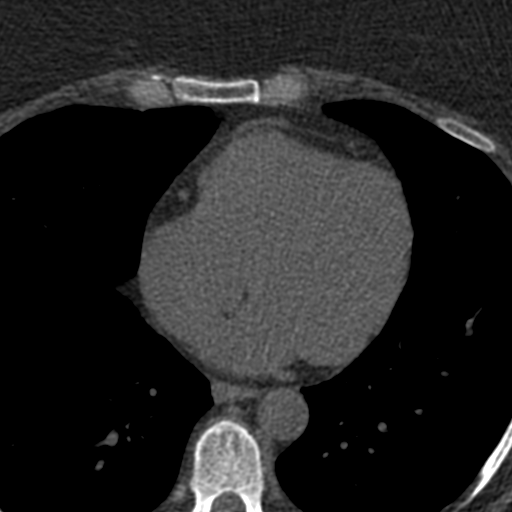
[im 46/76  vessel]
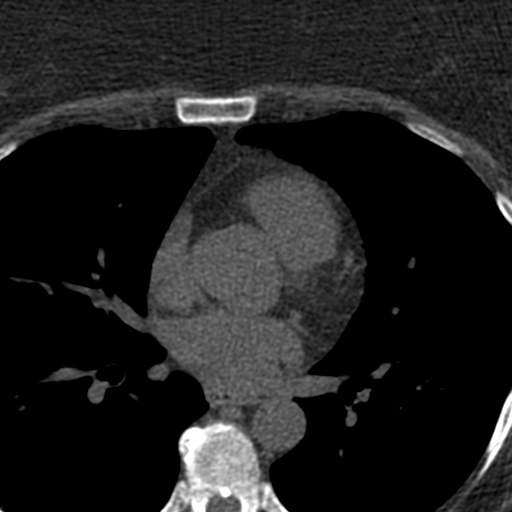
[im 61/76  vessel]
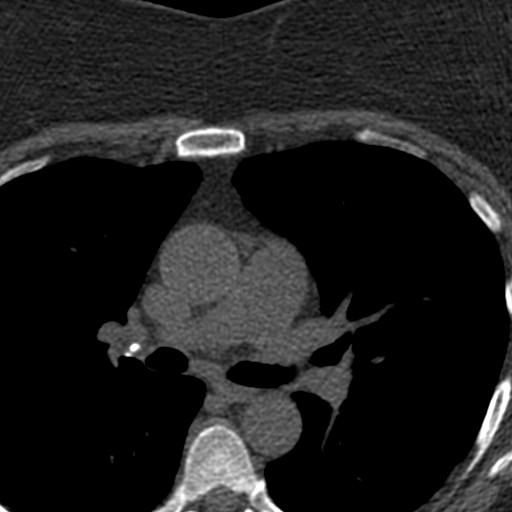

[Series 3: cascseq 2.0 bf37 st · axial · 0.70mm/px · z∈[-255,-155]mm · 5 of 76 slices shown, 7 images]
[im 13/76  vessel]
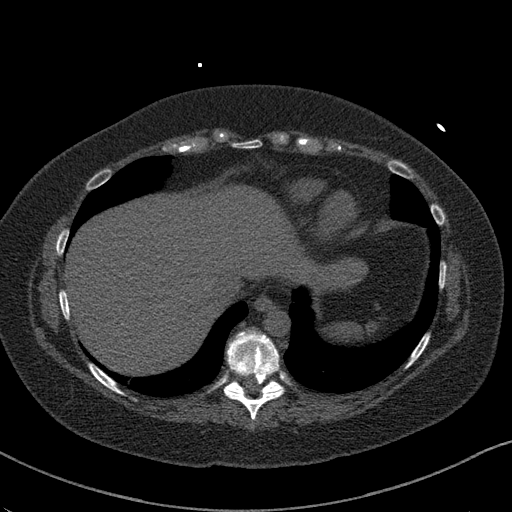
[im 13/76  lung]
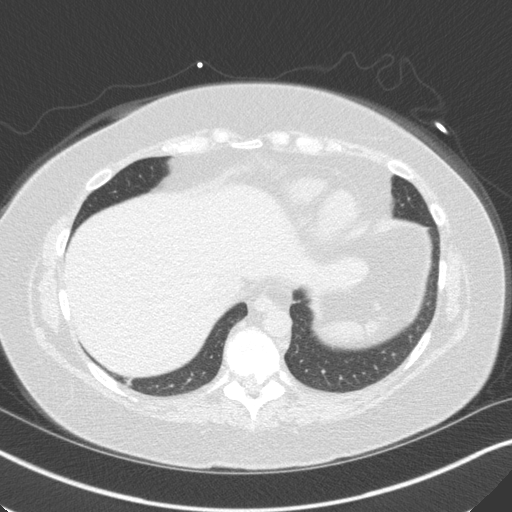
[im 26/76  vessel]
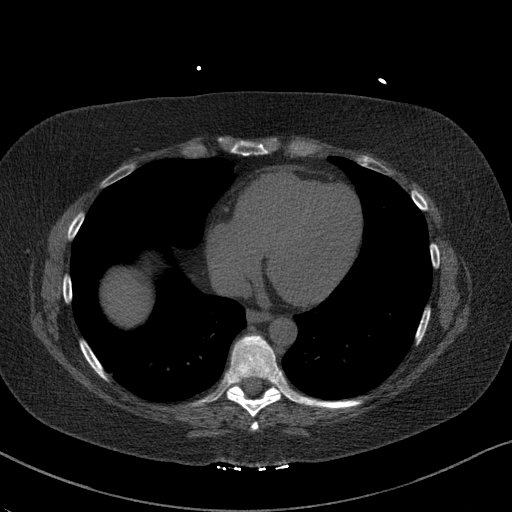
[im 38/76  vessel]
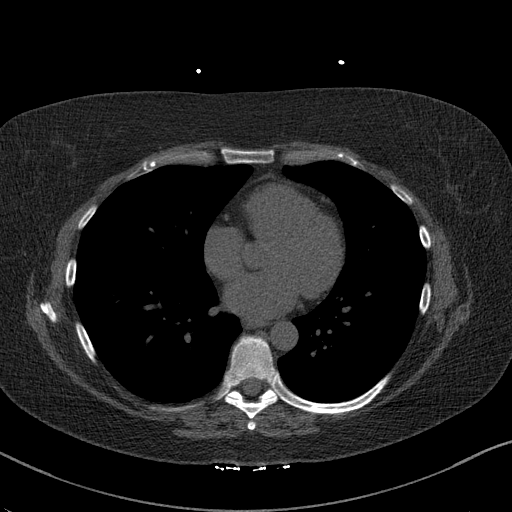
[im 51/76  vessel]
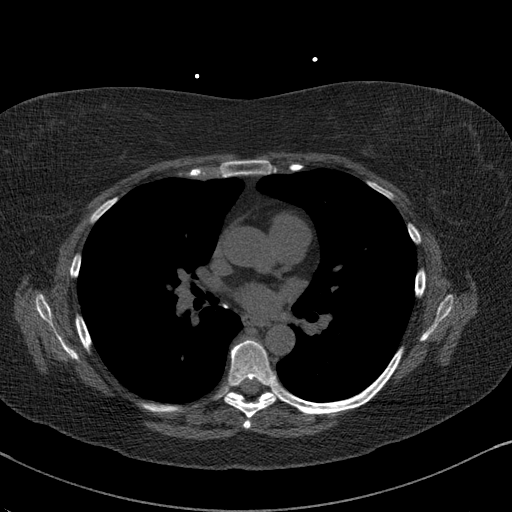
[im 63/76  vessel]
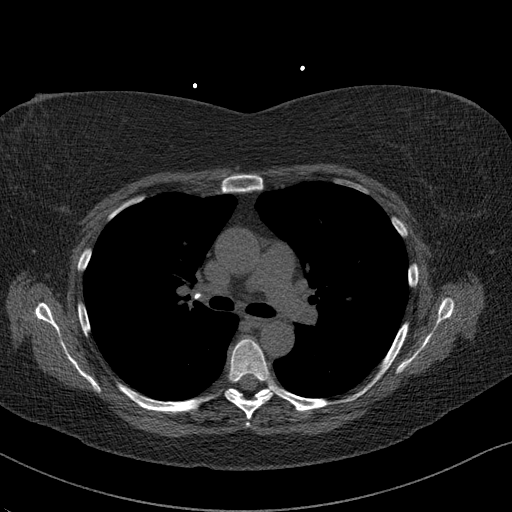
[im 63/76  lung]
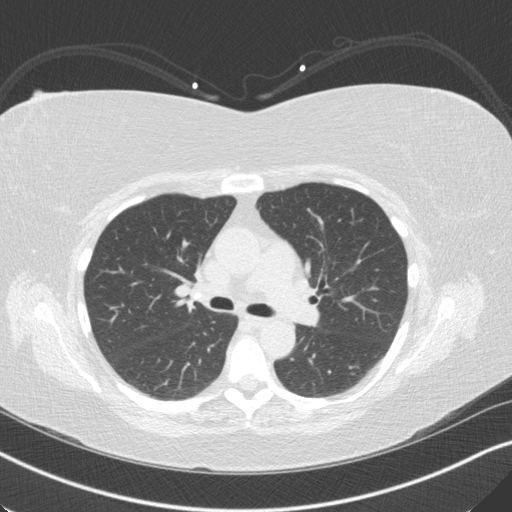

[Series 4: cascseq 2.0 br59 lung · axial · 0.70mm/px · z∈[-255,-155]mm · 5 of 76 slices shown]
[im 13/76  lung]
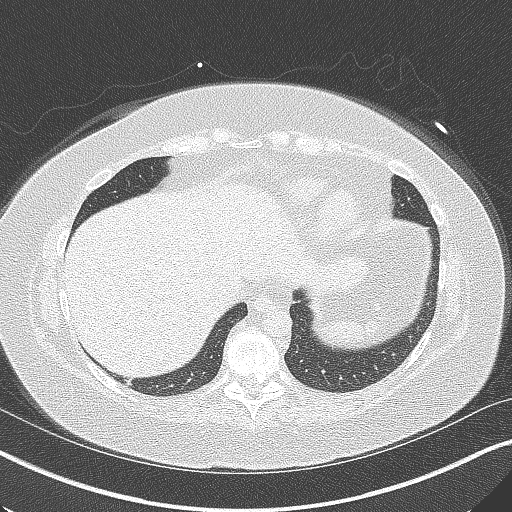
[im 26/76  lung]
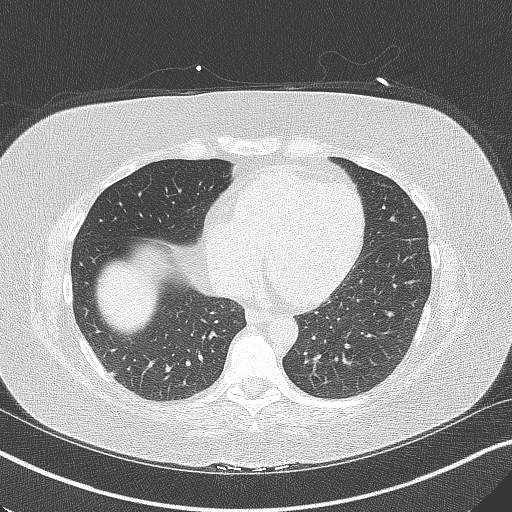
[im 38/76  lung]
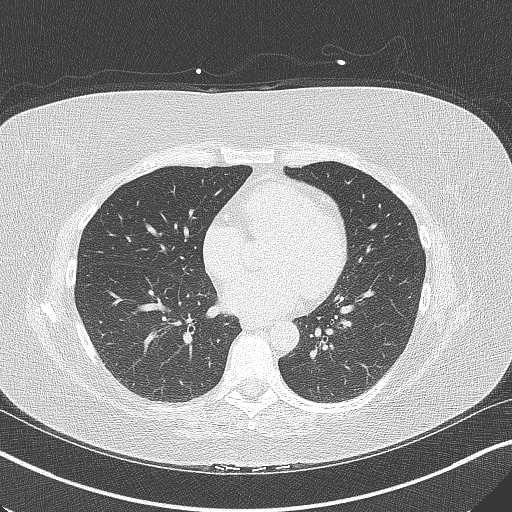
[im 51/76  lung]
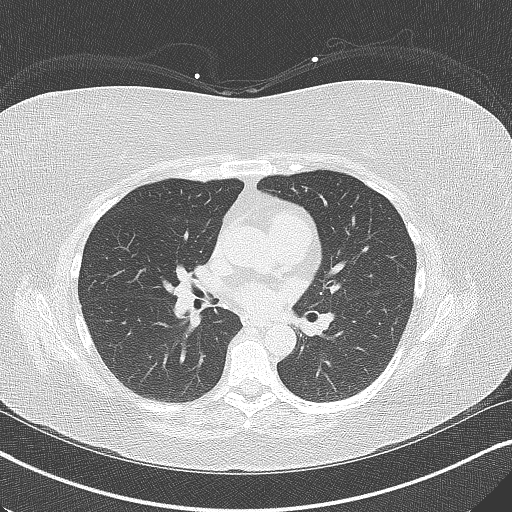
[im 63/76  lung]
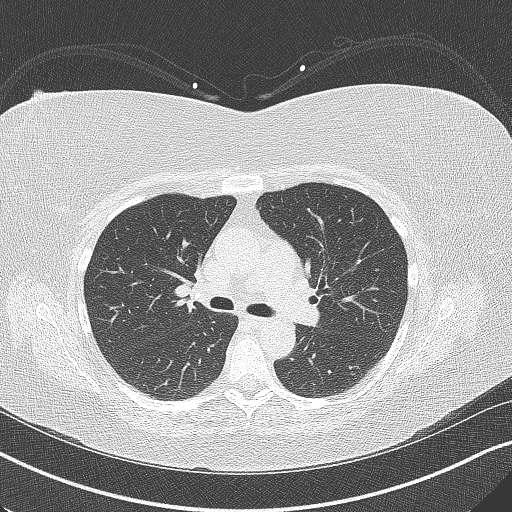

[14 of 20 positions shown; findings below may reference images not displayed]

FINDINGS: Vascular: Normal aortic caliber.

Mediastinum/Nodes: Calcified mediastinal and right hilar nodes are
likely related to old granulomatous disease.

Lungs/Pleura: No pleural fluid. Superior segment right lower lobe
nodularity on [DATE] is unchanged. There is an adjacent calcified
granuloma.

Subpleural right-sided pulmonary nodules of up to 4 mm are similar
and can be presumed benign subpleural lymph nodes.

Upper Abdomen: Normal imaged portions of the liver, spleen.

Musculoskeletal: No acute osseous abnormality.
IMPRESSION: No acute findings in the imaged extracardiac chest.

ADDENDUM:
Cardiovascular Disease Risk stratification

EXAM:
Coronary Calcium Score
FINDINGS: Coronary arteries: Normal origins.

Coronary Calcium Score:

Left main: 0

Left anterior descending artery: 0

Left circumflex artery: 0

Right coronary artery: 0

Total: 0

Percentile: 0

Pericardium: Normal.

Ascending Aorta: Normal caliber.

Non-cardiac: See separate report from [REDACTED].
IMPRESSION: Coronary calcium score of 0. This was 0 percentile for age-, race-,
and sex-matched controls.



If CAC=0, it is reasonable to withhold statin therapy and reassess
in 5 to 10 years, as long as higher risk conditions are absent
(diabetes mellitus, family history of premature CHD in first degree
relatives (males <55 years; females <65 years), cigarette smoking,
or LDL >=190 mg/dL).

If CAC is 1 to 99, it is reasonable to initiate statin therapy for
patients >=55 years of age.

If CAC is >=100 or >=75th percentile, it is reasonable to initiate
statin therapy at any age.

Cardiology referral should be considered for patients with CAC
scores >=400 or >=75th percentile.

*9551 AHA/ACC/AACVPR/AAPA/ABC/WALLES/NEMOCON/SUM[place]NA/[place]/MANGO/OMNIS[place]/K[place]
Guideline on the Management of Blood Cholesterol: A Report of the
American College of Cardiology/American Heart Association Task Force
on Clinical Practice Guidelines. J Am Coll Cardiol.
7039;73(24):1989-1502.

Jovita Spiers, DO

*** End of Addendum ***
EXAM:
OVER-READ INTERPRETATION  CT CHEST

The following report is an over-read performed by radiologist Dr.
over-read does not include interpretation of cardiac or coronary
anatomy or pathology. The calcium score interpretation by the
cardiologist is attached.
FINDINGS: Vascular: Normal aortic caliber.

Mediastinum/Nodes: Calcified mediastinal and right hilar nodes are
likely related to old granulomatous disease.

Lungs/Pleura: No pleural fluid. Superior segment right lower lobe
nodularity on [DATE] is unchanged. There is an adjacent calcified
granuloma.

Subpleural right-sided pulmonary nodules of up to 4 mm are similar
and can be presumed benign subpleural lymph nodes.

Upper Abdomen: Normal imaged portions of the liver, spleen.

Musculoskeletal: No acute osseous abnormality.
IMPRESSION: No acute findings in the imaged extracardiac chest.

## 2024-01-06 ENCOUNTER — Encounter: Payer: Self-pay | Admitting: Vascular Surgery
